# Patient Record
Sex: Female | Born: 1950 | Race: Black or African American | Hispanic: No | Marital: Single
Health system: Midwestern US, Community
[De-identification: ages and names within clinical notes are randomized; demographics above are authoritative.]

## PROBLEM LIST (undated history)

## (undated) DIAGNOSIS — I10 Essential (primary) hypertension: Secondary | ICD-10-CM

## (undated) DIAGNOSIS — E0590 Thyrotoxicosis, unspecified without thyrotoxic crisis or storm: Secondary | ICD-10-CM

## (undated) DIAGNOSIS — R935 Abnormal findings on diagnostic imaging of other abdominal regions, including retroperitoneum: Secondary | ICD-10-CM

## (undated) DIAGNOSIS — R634 Abnormal weight loss: Secondary | ICD-10-CM

## (undated) DIAGNOSIS — F17210 Nicotine dependence, cigarettes, uncomplicated: Secondary | ICD-10-CM

## (undated) DIAGNOSIS — M810 Age-related osteoporosis without current pathological fracture: Secondary | ICD-10-CM

## (undated) DIAGNOSIS — E7800 Pure hypercholesterolemia, unspecified: Secondary | ICD-10-CM

## (undated) DIAGNOSIS — R7303 Prediabetes: Principal | ICD-10-CM

## (undated) DIAGNOSIS — M816 Localized osteoporosis [Lequesne]: Secondary | ICD-10-CM

## (undated) DIAGNOSIS — M5416 Radiculopathy, lumbar region: Secondary | ICD-10-CM

## (undated) DIAGNOSIS — R3129 Other microscopic hematuria: Principal | ICD-10-CM

## (undated) DIAGNOSIS — E538 Deficiency of other specified B group vitamins: Principal | ICD-10-CM

---

## 2012-07-31 ENCOUNTER — Encounter

## 2016-10-26 ENCOUNTER — Emergency Department: Payer: Self-pay

## 2016-10-26 ENCOUNTER — Encounter: Payer: Self-pay | Admitting: Emergency Medicine

## 2016-10-26 ENCOUNTER — Observation Stay
Admission: EM | Admit: 2016-10-26 | Discharge: 2016-10-27 | Disposition: A | Discharge status: Home / Self Care | Payer: Self-pay | Attending: Internal Medicine | Admitting: Internal Medicine

## 2016-10-26 DIAGNOSIS — R079 Chest pain, unspecified: Secondary | ICD-10-CM | POA: Diagnosis present

## 2016-10-26 DIAGNOSIS — I10 Essential (primary) hypertension: Secondary | ICD-10-CM | POA: Insufficient documentation

## 2016-10-26 DIAGNOSIS — R0789 Other chest pain: Principal | ICD-10-CM | POA: Insufficient documentation

## 2016-10-26 DIAGNOSIS — E876 Hypokalemia: Secondary | ICD-10-CM | POA: Insufficient documentation

## 2016-10-26 DIAGNOSIS — Z885 Allergy status to narcotic agent status: Secondary | ICD-10-CM | POA: Insufficient documentation

## 2016-10-26 DIAGNOSIS — F17210 Nicotine dependence, cigarettes, uncomplicated: Secondary | ICD-10-CM | POA: Insufficient documentation

## 2016-10-26 HISTORY — DX: Essential (primary) hypertension: I10

## 2016-10-26 LAB — CBC
HEMATOCRIT: 41.3 % (ref 35.0–47.0)
HEMOGLOBIN: 14.3 g/dL (ref 12.0–16.0)
MCH: 31.2 pg (ref 26.0–34.0)
MCHC: 34.6 g/dL (ref 32.0–36.0)
MCV: 90.1 fL (ref 80.0–100.0)
Platelets: 269 10*3/uL (ref 150–440)
RBC: 4.59 MIL/uL (ref 3.80–5.20)
RDW: 14.3 % (ref 11.5–14.5)
WBC: 5.8 10*3/uL (ref 3.6–11.0)

## 2016-10-26 LAB — LIPID PANEL
CHOLESTEROL: 219 mg/dL — AB (ref 0–200)
HDL: 54 mg/dL (ref >40)
LDL Cholesterol: 145 mg/dL — ABNORMAL HIGH (ref 0–99)
Total CHOL/HDL Ratio: 4.1 RATIO
Triglycerides: 102 mg/dL (ref <150)
VLDL: 20 mg/dL (ref 0–40)

## 2016-10-26 LAB — COMPREHENSIVE METABOLIC PANEL
ALBUMIN: 4.7 g/dL (ref 3.5–5.0)
ALK PHOS: 89 U/L (ref 38–126)
ALT: 23 U/L (ref 14–54)
AST: 32 U/L (ref 15–41)
Anion gap: 13 (ref 5–15)
BUN: 10 mg/dL (ref 6–20)
CALCIUM: 10.1 mg/dL (ref 8.9–10.3)
CO2: 24 mmol/L (ref 22–32)
CREATININE: 0.76 mg/dL (ref 0.44–1.00)
Chloride: 101 mmol/L (ref 101–111)
GFR calc Af Amer: >60 mL/min (ref >60)
GFR calc non Af Amer: >60 mL/min (ref >60)
GLUCOSE: 81 mg/dL (ref 65–99)
Potassium: 3.4 mmol/L — ABNORMAL LOW (ref 3.5–5.1)
SODIUM: 138 mmol/L (ref 135–145)
Total Bilirubin: 0.6 mg/dL (ref 0.3–1.2)
Total Protein: 9.2 g/dL — ABNORMAL HIGH (ref 6.5–8.1)

## 2016-10-26 LAB — TROPONIN I
Troponin I: <0.03 ng/mL (ref <0.03)
Troponin I: <0.03 ng/mL (ref <0.03)

## 2016-10-26 IMAGING — DX DG CHEST 1V PORT
1 series · 1 of 1 positions shown · non-contrast
Comparison: Portable exam [9R] hours without priors for comparison.

CLINICAL DATA: Nonradiating central LEFT chest pain beginning
today, history hypertension

EXAM:
PORTABLE CHEST 1 VIEW

[chest ap]
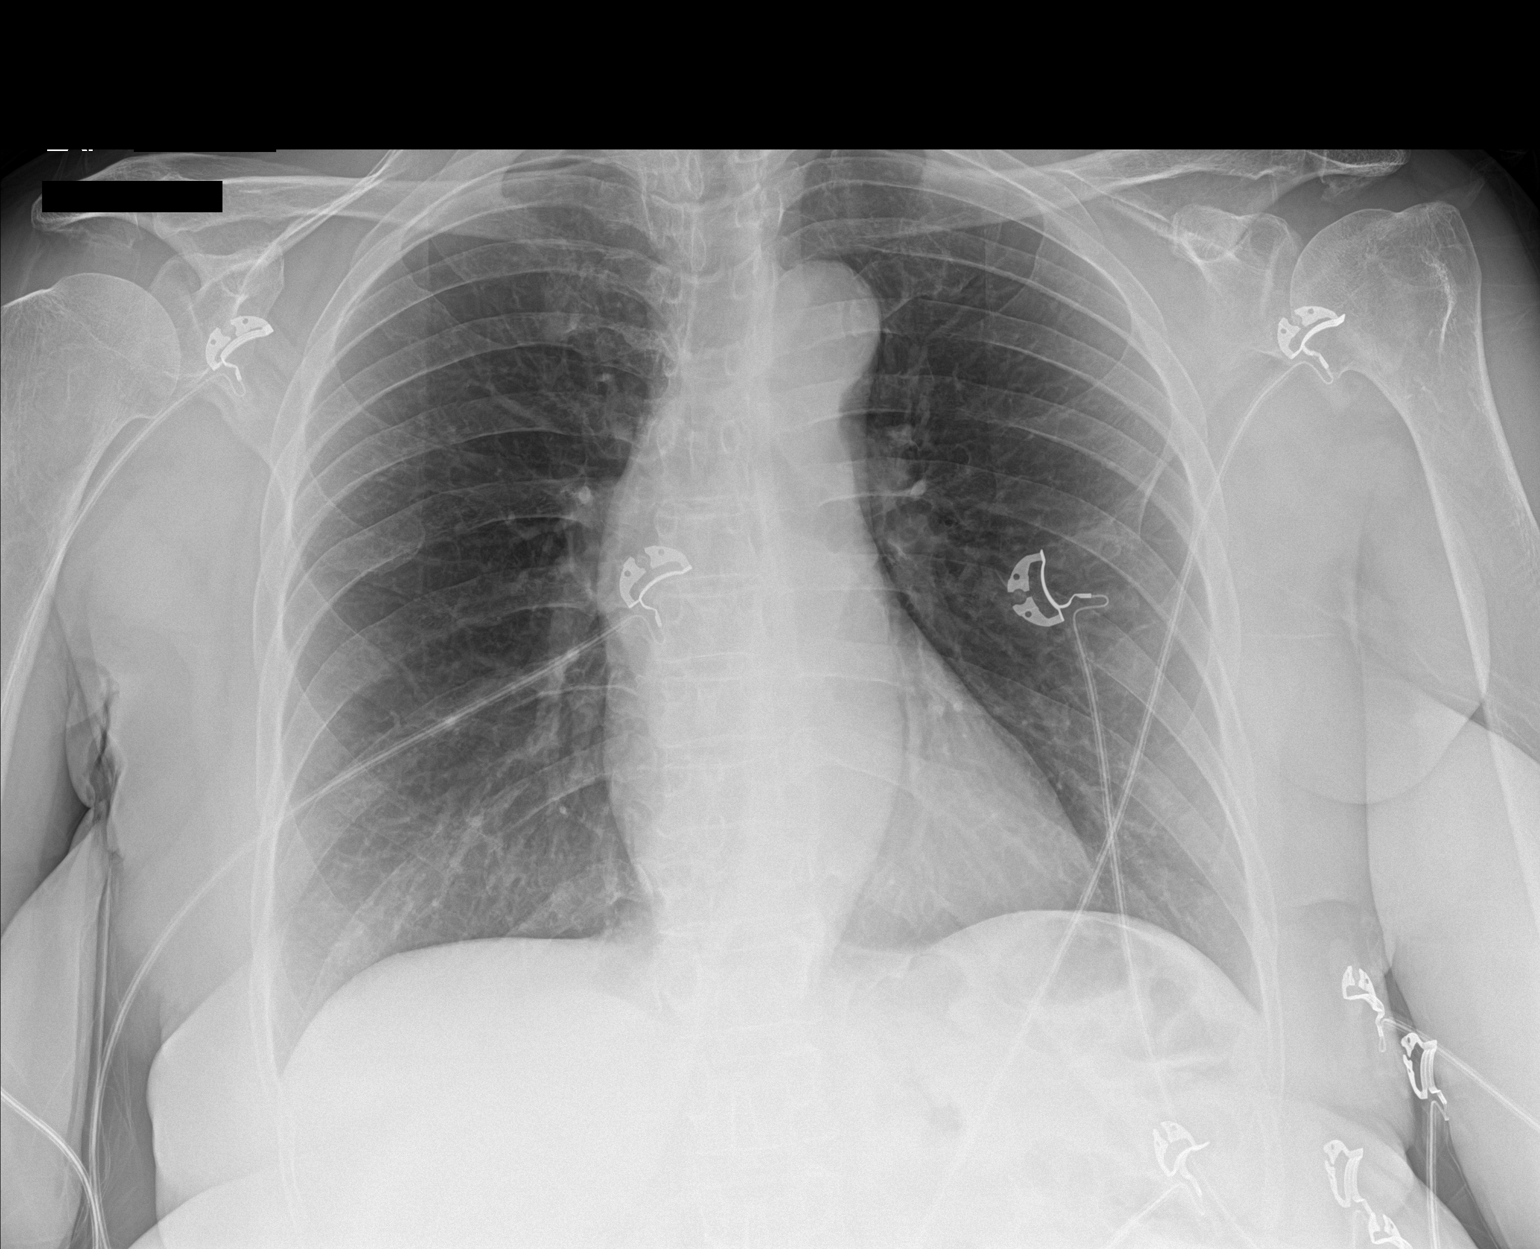

[1 of 1 positions shown; findings below may reference images not displayed]

FINDINGS: Normal heart size and pulmonary vascularity.

Mildly tortuous thoracic aorta with minimal atherosclerotic
calcification at arch.

Lungs clear.

No pleural effusion or pneumothorax.

Bones appear demineralized.
IMPRESSION: No acute abnormalities.

## 2016-10-26 MED ORDER — SODIUM CHLORIDE 0.9% FLUSH
3.0000 mL | Freq: Two times a day (BID) | INTRAVENOUS | Status: DC
  Administered 2016-10-26 – 2016-10-27 (×2): 3 mL via INTRAVENOUS

## 2016-10-26 MED ORDER — ACETAMINOPHEN 650 MG RE SUPP: 650.0000 mg | Freq: Four times a day (QID) | RECTAL | Status: DC | PRN

## 2016-10-26 MED ORDER — LISINOPRIL-HYDROCHLOROTHIAZIDE 20-12.5 MG PO TABS: 1.0000 | ORAL_TABLET | Freq: Every day | ORAL | Status: DC

## 2016-10-26 MED ORDER — ACETAMINOPHEN 325 MG PO TABS
650.0000 mg | ORAL_TABLET | Freq: Four times a day (QID) | ORAL | Status: DC | PRN
Start: 2016-10-26 — End: 2016-10-27

## 2016-10-26 MED ORDER — SODIUM CHLORIDE 0.9 % IV SOLN: 250.0000 mL | INTRAVENOUS | Status: DC | PRN

## 2016-10-26 MED ORDER — HYDRALAZINE HCL 20 MG/ML IJ SOLN: 10.0000 mg | Freq: Four times a day (QID) | INTRAMUSCULAR | Status: DC | PRN

## 2016-10-26 MED ORDER — METOPROLOL TARTRATE 25 MG PO TABS
25.0000 mg | ORAL_TABLET | Freq: Two times a day (BID) | ORAL | Status: DC
  Administered 2016-10-26: 25 mg via ORAL
  Filled 2016-10-26 (×2): qty 1

## 2016-10-26 MED ORDER — HYDROCHLOROTHIAZIDE 12.5 MG PO CAPS
12.5000 mg | ORAL_CAPSULE | Freq: Every day | ORAL | Status: DC
  Filled 2016-10-26: qty 1

## 2016-10-26 MED ORDER — POTASSIUM CHLORIDE CRYS ER 20 MEQ PO TBCR: 40.0000 meq | EXTENDED_RELEASE_TABLET | Freq: Once | ORAL | Status: DC

## 2016-10-26 MED ORDER — SENNOSIDES-DOCUSATE SODIUM 8.6-50 MG PO TABS: 1.0000 | ORAL_TABLET | Freq: Every evening | ORAL | Status: DC | PRN

## 2016-10-26 MED ORDER — LISINOPRIL 20 MG PO TABS
20.0000 mg | ORAL_TABLET | Freq: Every day | ORAL | Status: DC
  Filled 2016-10-26: qty 1

## 2016-10-26 MED ORDER — ENOXAPARIN SODIUM 40 MG/0.4ML ~~LOC~~ SOLN
40.0000 mg | SUBCUTANEOUS | Status: DC
  Administered 2016-10-26: 40 mg via SUBCUTANEOUS
  Filled 2016-10-26: qty 0.4

## 2016-10-26 MED ORDER — ASPIRIN 81 MG PO CHEW
81.0000 mg | CHEWABLE_TABLET | Freq: Every day | ORAL | Status: DC
  Administered 2016-10-27: 81 mg via ORAL
  Filled 2016-10-26: qty 1

## 2016-10-26 MED ORDER — NICOTINE 21 MG/24HR TD PT24
21.0000 mg | MEDICATED_PATCH | Freq: Every day | TRANSDERMAL | Status: DC
  Filled 2016-10-26: qty 1

## 2016-10-26 MED ORDER — ONDANSETRON HCL 4 MG/2ML IJ SOLN: 4.0000 mg | Freq: Four times a day (QID) | INTRAMUSCULAR | Status: DC | PRN

## 2016-10-26 MED ORDER — METOPROLOL TARTRATE 25 MG PO TABS
25.0000 mg | ORAL_TABLET | Freq: Two times a day (BID) | ORAL | Status: DC
  Administered 2016-10-27: 25 mg via ORAL
  Filled 2016-10-26: qty 1

## 2016-10-26 MED ORDER — SODIUM CHLORIDE 0.9% FLUSH: 3.0000 mL | INTRAVENOUS | Status: DC | PRN

## 2016-10-26 MED ORDER — TRAMADOL HCL 50 MG PO TABS: 50.0000 mg | ORAL_TABLET | Freq: Four times a day (QID) | ORAL | Status: DC | PRN

## 2016-10-26 MED ORDER — ONDANSETRON HCL 4 MG PO TABS: 4.0000 mg | ORAL_TABLET | Freq: Four times a day (QID) | ORAL | Status: DC | PRN

## 2016-10-26 MED ORDER — NITROGLYCERIN 0.4 MG SL SUBL: 0.4000 mg | SUBLINGUAL_TABLET | SUBLINGUAL | Status: DC | PRN

## 2016-10-26 MED ORDER — ASPIRIN 81 MG PO CHEW
324.0000 mg | CHEWABLE_TABLET | Freq: Once | ORAL | Status: AC
  Administered 2016-10-26: 324 mg via ORAL
  Filled 2016-10-26: qty 4

## 2016-10-26 NOTE — ED Triage Notes (Signed)
Pt reports non-radiating central to left chest pain that started today. Pt reports the pain is intermittent and denies any associated symptoms. Pt reports she has been feeling really tired lately.

## 2016-10-26 NOTE — ED Notes (Signed)
Pt stuck x4 for blood and IV.  Unsuccessful.  EDP notified and to ultrasound IV at this time.

## 2016-10-26 NOTE — ED Provider Notes (Signed)
[facility] Emergency Department Provider Note  ____________________________________________  Time seen: Approximately 3:55 PM  I have reviewed the triage vital signs and the nursing notes.   HISTORY  Chief Complaint Chest Pain    HPI Kimberly Levine is a 66 y.o. female who complains of central chest pain that started today approximately 3 PM. Pain is intermittent, lasting a minute at a time, reoccurring multiple times. Nonradiating, no associated shortness of breath diaphoresis or vomiting. She does report that she feels dizzy and somewhat out. Denies any trauma. No paresthesias numbness tingling weakness headache. Never had anything like this before. Does have a history of hypertension as well as prediabetes, she is a smoker. Patient reports that her blood pressures also running higher, normally is about 1:30 systolic and today is 180. Has been compliant with her antihypertensive.    Past Medical History:  Diagnosis Date  . Hypertension      There are no active problems to display for this patient.    No past surgical history on file. right neck lipoma resection  Prior to Admission medications   Medication Sig Start Date End Date Taking? Authorizing Provider  lisinopril-hydrochlorothiazide (PRINZIDE,ZESTORETIC) 20-12.5 MG tablet Take 1 tablet by mouth daily.   Yes [provider]  lisinopril HCTZ   Allergies Morphine and related and Percocet [oxycodone-acetaminophen]   No family history on file.  Social History Social History  Substance Use Topics  . Smoking status: Not on file  . Smokeless tobacco: Not on file  . Alcohol use Not on file  positive daily smoker. No significant alcohol use.  Review of Systems  Constitutional:   No fever or chills.  ENT:   No sore throat. No rhinorrhea. Cardiovascular:   positive as above chest pain without syncope. Respiratory:   No dyspnea or cough. Gastrointestinal:   Negative for abdominal  pain, vomiting and diarrhea.  Musculoskeletal:   Negative for focal pain or swelling All other systems reviewed and are negative except as documented above in ROS and HPI.  ____________________________________________   PHYSICAL EXAM:  VITAL SIGNS: ED Triage Vitals  Enc Vitals Group     BP 10/26/16 1538 (!) 186/113     Pulse Rate 10/26/16 1538 (!) 101     Resp 10/26/16 1538 18     Temp 10/26/16 1538 98 F (36.7 C)     Temp Source 10/26/16 1538 Oral     SpO2 10/26/16 1538 100 %     Weight 10/26/16 1539 167 lb (75.8 kg)     Height 10/26/16 1539 5' 1.5" (1.562 m)     Head Circumference --      Peak Flow --      Pain Score 10/26/16 1538 0     Pain Loc --      Pain Edu? --      Excl. in GC? --     Vital signs reviewed, nursing assessments reviewed.   Constitutional:   Alert and oriented. Well appearing and in no distress. Eyes:   No scleral icterus.  EOMI. No nystagmus. No conjunctival pallor. PERRL. ENT   Head:   Normocephalic and atraumatic.   Nose:   No congestion/rhinnorhea.    Mouth/Throat:   MMM, no pharyngeal erythema. No peritonsillar mass.    Neck:   No meningismus. Full ROM Hematological/Lymphatic/Immunilogical:   No cervical lymphadenopathy. Cardiovascular:   RRR. Symmetric bilateral radial and DP pulses.  No murmurs.  Respiratory:   Normal respiratory effort without tachypnea/retractions. Breath sounds are clear  and equal bilaterally. No wheezes/rales/rhonchi. Gastrointestinal:   Soft and nontender. Non distended. There is no CVA tenderness.  No rebound, rigidity, or guarding. Genitourinary:   deferred Musculoskeletal:   Normal range of motion in all extremities. No joint effusions.  No lower extremity tenderness.  No edema.chest wall nontender. Neurologic:   Normal speech and language.  Motor grossly intact. No gross focal neurologic deficits are appreciated.  Skin:    Skin is warm, dry and intact. No rash noted.  No petechiae, purpura, or  bullae.  ____________________________________________    LABS (pertinent positives/negatives) (all labs ordered are listed, but only abnormal results are displayed) Labs Reviewed  CBC  TROPONIN I  COMPREHENSIVE METABOLIC PANEL   ____________________________________________   EKG  EKG interpreted by me Normal sinus rhythm rate of 82, left axis, normal intervals. High lateral Q waves, ST depression and T-wave inversions in the inferior leads, slight T-wave inversions in V3 and V4. No prior EKGs for comparison. Concerning for possible inferior ischemia.  Repeat EKG performed at 4:18 PM, interpreted by me Sinus rhythm rate of 69, left axis, normal intervals. Still slight ST depression in the inferior leads, but overall improved compared to the initial EKG. ____________________________________________    RADIOLOGY  Dg Chest Portable 1 View  Result Date: 10/26/2016 CLINICAL DATA:  Nonradiating central LEFT chest pain beginning today, history hypertension EXAM: PORTABLE CHEST 1 VIEW COMPARISON:  Portable exam 1605 hours without priors for comparison. FINDINGS: Normal heart size and pulmonary vascularity. Mildly tortuous thoracic aorta with minimal atherosclerotic calcification at arch. Lungs clear. No pleural effusion or pneumothorax. Bones appear demineralized. IMPRESSION: No acute abnormalities. Electronically Signed   By: Ulyses SouthwardMark  Boles M.D.   On: 10/26/2016 16:24    ____________________________________________   PROCEDURES Procedures Peripheral IV insertion by physician Indication: Multiple failed attempts by nursing staff, need for IV access and/or blood samples for workup Performed under continuous real-time ultrasound visualization Area cleaned with chlorhexidine. 20-gauge IV successfully placed in the right antecubital fossa. 1 attempt, no complications, EBL 0.   ____________________________________________   INITIAL IMPRESSION / ASSESSMENT AND PLAN / ED  COURSE  Pertinent labs & imaging results that were available during my care of the patient were reviewed by me and considered in my medical decision making (see chart for details).  patient presents with somewhat atypical chest pain but concerning EKG. We'll check labs chest x-ray, repeat EKG to evaluate for any evolution. Patient is not low risk, but symptoms are not consistent with unstable angina. Troponin normal, with still plan to hospitalize the patient for further evaluation and risk stratification.  Clinical Course as of Oct 26 1737  Fri Oct 26, 2016  1645 Kore[place] piv placed by me, labs pending  [PS]    Clinical Course User Index [PS] Sharman CheekStafford, Donal Lynam, MD    ----------------------------------------- 5:39 PM on 10/26/2016 -----------------------------------------  Troponin negative, chest pain resolved without nitrates. I will discuss with hospitalist for further evaluation.   ____________________________________________   FINAL CLINICAL IMPRESSION(S) / ED DIAGNOSES  Final diagnoses:  Chest pain, unspecified type      New Prescriptions   No medications on file     Portions of this note were generated with dragon dictation software. Dictation errors may occur despite best attempts at proofreading.    Sharman CheekStafford, Graham Hyun, MD 10/26/16 1740

## 2016-10-26 NOTE — H&P (Signed)
[facility]   PATIENT NAME: Kimberly Levine    MR#:  [id number]  DATE OF BIRTH:  10-04-1950  DATE OF ADMISSION:  10/26/2016  PRIMARY CARE PHYSICIAN: Patient, No Pcp Per   REQUESTING/REFERRING PHYSICIAN: dr Scotty Courtstafford  CHIEF COMPLAINT:    Generalized weakness, elevated blood pressure and chest pain HISTORY OF PRESENT ILLNESS:  Kimberly Levine  is a 66 y.o. female with a known history of Essential hypertension who presents today with above complaint. Patient reports over the past 3 days she has had generalized weakness and not feeling herself. Today she had about 5 second episodes of chest pain that radiated to her jaw. She denies shortness of breath, nausea associated with the chest pain. She denies relieving or aggravating factors but she says it comes onset only and then went away after just 5-10 seconds. She had a few episodes today. She has noted that her blood pressure is elevated more so than normal. She says that her blood pressures usually control. [place] [facility] [place] and will need to establish primary care. She is currently chest pain-free. Her initial EKG did show ST depression in the inferior leads. Repeat EKG shows ST depression in the inferior leads but improved compared to initial EKG. During the interim she has received nitroglycerin. Her blood pressure was 186/113 when she arrived to the emergency room.   PAST MEDICAL HISTORY:   Past Medical History:  Diagnosis Date  . Hypertension     PAST SURGICAL HISTORY:  None  SOCIAL HISTORY:  He smokes one pack a day no IV drug use  FAMILY HISTORY:  [place] hypertension  DRUG ALLERGIES:   Allergies  Allergen Reactions  . Morphine And Related Itching  . Percocet [Oxycodone-Acetaminophen] Itching    REVIEW OF SYSTEMS:   Review of Systems  Constitutional: Positive for malaise/fatigue. Negative for chills and fever.  HENT: Negative.  Negative for ear discharge,  ear pain, hearing loss, nosebleeds and sore throat.   Eyes: Negative.  Negative for blurred vision and pain.  Respiratory: Negative.  Negative for cough, hemoptysis, shortness of breath and wheezing.   Cardiovascular: Positive for chest pain. Negative for palpitations and leg swelling.  Gastrointestinal: Negative.  Negative for abdominal pain, blood in stool, diarrhea, nausea and vomiting.  Genitourinary: Negative.  Negative for dysuria.  Musculoskeletal: Negative.  Negative for back pain.  Skin: Negative.   Neurological: Positive for weakness. Negative for dizziness, tremors, speech change, focal weakness, seizures and headaches.  Endo/Heme/Allergies: Negative.  Does not bruise/bleed easily.  Psychiatric/Behavioral: Negative.  Negative for depression, hallucinations and suicidal ideas.    MEDICATIONS AT HOME:   Prior to Admission medications   Medication Sig Start Date End Date Taking? Authorizing Provider  lisinopril-hydrochlorothiazide (PRINZIDE,ZESTORETIC) 20-12.5 MG tablet Take 1 tablet by mouth daily.   Yes [provider]      VITAL SIGNS:  Blood pressure (!) 158/109, pulse 67, temperature 98 F (36.7 C), temperature source Oral, resp. rate (!) 22, height 5' 1.5" (1.562 m), weight 75.8 kg (167 lb), SpO2 97 %.  PHYSICAL EXAMINATION:   Physical Exam  Constitutional: She is oriented to person, place, and time and well-developed, well-nourished, and in no distress. No distress.  HENT:  Head: Normocephalic.  Eyes: No scleral icterus.  Neck: Normal range of motion. Neck supple. No JVD present. No tracheal deviation present.  Cardiovascular: Normal rate, regular rhythm and normal heart sounds.  Exam reveals no gallop and no friction rub.  No murmur heard. Pulmonary/Chest: Effort normal and breath sounds normal. No respiratory distress. She has no wheezes. She has no rales. She exhibits no tenderness.  Abdominal: Soft. Bowel sounds are normal. She exhibits no distension  and no mass. There is no tenderness. There is no rebound and no guarding.  Musculoskeletal: Normal range of motion. She exhibits no edema.  Neurological: She is alert and oriented to person, place, and time.  Skin: Skin is warm. No rash noted. No erythema.  Psychiatric: Affect and judgment normal.      LABORATORY PANEL:   CBC  Recent Labs Lab 10/26/16 1537  WBC 5.8  HGB 14.3  HCT 41.3  PLT 269   ------------------------------------------------------------------------------------------------------------------  Chemistries   Recent Labs Lab 10/26/16 1555  NA 138  K 3.4*  CL 101  CO2 24  GLUCOSE 81  BUN 10  CREATININE 0.76  CALCIUM 10.1  AST 32  ALT 23  ALKPHOS 89  BILITOT 0.6   ------------------------------------------------------------------------------------------------------------------  Cardiac Enzymes  Recent Labs Lab 10/26/16 1537  TROPONINI <0.03   ------------------------------------------------------------------------------------------------------------------  RADIOLOGY:  Dg Chest Portable 1 View  Result Date: 10/26/2016 CLINICAL DATA:  Nonradiating central LEFT chest pain beginning today, history hypertension EXAM: PORTABLE CHEST 1 VIEW COMPARISON:  Portable exam 1605 hours without priors for comparison. FINDINGS: Normal heart size and pulmonary vascularity. Mildly tortuous thoracic aorta with minimal atherosclerotic calcification at arch. Lungs clear. No pleural effusion or pneumothorax. Bones appear demineralized. IMPRESSION: No acute abnormalities. Electronically Signed   By: Ulyses Southward M.D.   On: 10/26/2016 16:24    EKG:  Initial EKG with ST depression in inferior leads Repeat EKG is similar however somewhat improved  IMPRESSION AND PLAN:   Exceed 17 female with history of hypertension who moved to the [facility] area about a week ago and now presents with generalized weakness, elevated blood pressure and chest pain.  1 Chest  pain: Continue telemetry monitoring Continued trending troponins If all troponins negative 3 and patient will undergo Myoview in a.m. Start aspirin and metoprolol, nitroglycerin subungual when necessary chest pain Check lipid panel and A1c  2. Uncontrolled hypertension: Add metoprolol to current regimen When necessary hydralazine Check echocardiogram to evaluate for LVH  3 hypokalemia: Repleted  4. Tobacco dependence: Patient is encouraged to quit smoking. Counseling was provided for 4 minutes. Nicotine patch ordered   All the records are reviewed and case discussed with ED provider. Management plans discussed with the patient and she is in agreement  She will need to establish primary care physician in this area. CODE STATUS: Full  TOTAL TIME TAKING CARE OF THIS PATIENT: 41 minutes.    Pepper Wyndham M.D on 10/26/2016 at 6:14 PM  Between 7am to 6pm - Pager - [phone]  After 6pm go to [email or web address] - password [email or web address]  [facility]  Office  [phone]  CC: Primary care physician; Patient, No Pcp Per

## 2016-10-27 ENCOUNTER — Observation Stay (HOSPITAL_BASED_OUTPATIENT_CLINIC_OR_DEPARTMENT_OTHER): Payer: Self-pay

## 2016-10-27 DIAGNOSIS — R079 Chest pain, unspecified: Secondary | ICD-10-CM

## 2016-10-27 LAB — TROPONIN I
Troponin I: <0.03 ng/mL (ref <0.03)
Troponin I: <0.03 ng/mL (ref <0.03)

## 2016-10-27 LAB — NM MYOCAR MULTI W/SPECT W/WALL MOTION / EF
CHL CUP RESTING HR STRESS: 69 {beats}/min
LV dias vol: 31 mL (ref 46–106)
LV sys vol: 10 mL
NUC STRESS TID: 0.91
Peak HR: 122 {beats}/min

## 2016-10-27 LAB — HEMOGLOBIN A1C
Hgb A1c MFr Bld: 5.8 % — ABNORMAL HIGH (ref 4.8–5.6)
Mean Plasma Glucose: 119.76 mg/dL

## 2016-10-27 MED ORDER — TECHNETIUM TC 99M TETROFOSMIN IV KIT
14.3400 | PACK | Freq: Once | INTRAVENOUS | Status: AC | PRN
  Administered 2016-10-27: 33 via INTRAVENOUS

## 2016-10-27 MED ORDER — METOPROLOL TARTRATE 25 MG PO TABS: 25.0000 mg | ORAL_TABLET | Freq: Two times a day (BID) | ORAL | 0 refills | Status: AC

## 2016-10-27 MED ORDER — REGADENOSON 0.4 MG/5ML IV SOLN
0.4000 mg | Freq: Once | INTRAVENOUS | Status: AC
  Administered 2016-10-27: 0.4 mg via INTRAVENOUS

## 2016-10-27 MED ORDER — HYDROCHLOROTHIAZIDE 12.5 MG PO CAPS
12.5000 mg | ORAL_CAPSULE | Freq: Every day | ORAL | Status: DC
  Administered 2016-10-27: 12.5 mg via ORAL

## 2016-10-27 MED ORDER — TECHNETIUM TC 99M TETROFOSMIN IV KIT
14.3400 | PACK | Freq: Once | INTRAVENOUS | Status: AC | PRN
  Administered 2016-10-27: 14.34 via INTRAVENOUS

## 2016-10-27 MED ORDER — LISINOPRIL 20 MG PO TABS
20.0000 mg | ORAL_TABLET | Freq: Every day | ORAL | Status: DC
  Administered 2016-10-27: 20 mg via ORAL

## 2016-10-27 MED ORDER — NICOTINE 21 MG/24HR TD PT24: 21.0000 mg | MEDICATED_PATCH | Freq: Every day | TRANSDERMAL | 0 refills | Status: AC

## 2016-10-27 NOTE — Discharge Summary (Signed)
[facility] -  at [facility]   PATIENT NAME: Kimberly Levine    MR#:  [id number]  DATE OF BIRTH:  08-18-1950  DATE OF ADMISSION:  10/26/2016 ADMITTING PHYSICIAN: Adrian Saran, MD  DATE OF DISCHARGE: 10/27/2016 PRIMARY CARE PHYSICIAN: Patient, No Pcp Per    ADMISSION DIAGNOSIS:  Chest pain, unspecified type [R07.9]  DISCHARGE DIAGNOSIS:  Atypical chest pain noncardiac  SECONDARY DIAGNOSIS:   Past Medical History:  Diagnosis Date  . Hypertension     HOSPITAL COURSE:  hpi  Kimberly Levine  is a 66 y.o. female with a known history of Essential hypertension who presents today with above complaint. Patient reports over the past 3 days she has had generalized weakness and not feeling herself. Today she had about 5 second episodes of chest pain that radiated to her jaw. She denies shortness of breath, nausea associated with the chest pain. She denies relieving or aggravating factors but she says it comes onset only and then went away after just 5-10 seconds. She had a few episodes today. She has noted that her blood pressure is elevated more so than normal. She says that her blood pressures usually control. She moved to [facility] about a week ago and will need to establish primary care. She is currently chest pain-free. Her initial EKG did show ST depression in the inferior leads. Repeat EKG shows ST depression in the inferior leads but improved compared to initial EKG. During the interim she has received nitroglycerin. Her blood pressure was 186/113 when she arrived to the emergency room.  1 Chest pain: Resolved Acute MI ruled out with 3 negative troponins Patient had Myoview test test which is a low risk study, left ventricular ejection fraction is greater than 65% Outpatient echocardiogram is recommended LDL at 145 ; hemoglobin A1c 5.8  2. Uncontrolled hypertension:  Blood pressure improved with metoprolol  Outpatient echocardiogram  3 hypokalemia:  Repleted  4. Tobacco dependence: Patient is encouraged to quit smoking. Counseling was provided for 4 minutes. Nicotine patch ordered   DISCHARGE CONDITIONS:   Stable  CONSULTS OBTAINED:     PROCEDURES -Myoview stress test low risk study  DRUG ALLERGIES:   Allergies  Allergen Reactions  . Morphine And Related Itching  . Percocet [Oxycodone-Acetaminophen] Itching    DISCHARGE MEDICATIONS:   Current Discharge Medication List    START taking these medications   Details  metoprolol tartrate (LOPRESSOR) 25 MG tablet Take 1 tablet (25 mg total) by mouth 2 (two) times daily. Qty: 60 tablet, Refills: 0    nicotine (NICODERM CQ - DOSED IN MG/24 HOURS) 21 mg/24hr patch Place 1 patch (21 mg total) onto the skin daily. Qty: 28 patch, Refills: 0      CONTINUE these medications which have NOT CHANGED   Details  lisinopril-hydrochlorothiazide (PRINZIDE,ZESTORETIC) 20-12.5 MG tablet Take 1 tablet by mouth daily.         DISCHARGE INSTRUCTIONS:   Follow-up with primary care physician in a week   DIET:  Cardiac diet  DISCHARGE CONDITION:  Stable  ACTIVITY:  Activity as tolerated  OXYGEN:  Home Oxygen: No.   Oxygen Delivery: room air  DISCHARGE LOCATION:  home   If you experience worsening of your admission symptoms, develop shortness of breath, life threatening emergency, suicidal or homicidal thoughts you must seek medical attention immediately by calling 911 or calling your MD immediately  if symptoms less severe.  You Must read complete instructions/literature along with all the possible adverse reactions/side effects for all  the Medicines you take and that have been prescribed to you. Take any new Medicines after you have completely understood and accpet all the possible adverse reactions/side effects.   Please note  You were cared for by a hospitalist during your hospital stay. If you have any questions about your discharge medications or the care you  received while you were in the hospital after you are discharged, you can call the unit and asked to speak with the hospitalist on call if the hospitalist that took care of you is not available. Once you are discharged, your primary care physician will handle any further medical issues. Please note that NO REFILLS for any discharge medications will be authorized once you are discharged, as it is imperative that you return to your primary care physician (or establish a relationship with a primary care physician if you do not have one) for your aftercare needs so that they can reassess your need for medications and monitor your lab values.     Today  Chief Complaint  Patient presents with  . Chest Pain   Patient is resting comfortably denies any chest pain or shortness of breath. Wants to go home. Patient had stress test today which is low risk study  ROS:  CONSTITUTIONAL: Denies fevers, chills. Denies any fatigue, weakness.  EYES: Denies blurry vision, double vision, eye pain. EARS, NOSE, THROAT: Denies tinnitus, ear pain, hearing loss. RESPIRATORY: Denies cough, wheeze, shortness of breath.  CARDIOVASCULAR: Denies chest pain, palpitations, edema.  GASTROINTESTINAL: Denies nausea, vomiting, diarrhea, abdominal pain. Denies bright red blood per rectum. GENITOURINARY: Denies dysuria, hematuria. ENDOCRINE: Denies nocturia or thyroid problems. HEMATOLOGIC AND LYMPHATIC: Denies easy bruising or bleeding. SKIN: Denies rash or lesion. MUSCULOSKELETAL: Denies pain in neck, back, shoulder, knees, hips or arthritic symptoms.  NEUROLOGIC: Denies paralysis, paresthesias.  PSYCHIATRIC: Denies anxiety or depressive symptoms.   VITAL SIGNS:  Blood pressure 128/79, pulse (!) 58, temperature 98.3 F (36.8 C), temperature source Oral, resp. rate 14, height 5' 1.5" (1.562 m), weight 75.8 kg (167 lb), SpO2 100 %.  I/O:    Intake/Output Summary (Last 24 hours) at 10/27/16 1301 Last data filed at  10/27/16 1222  Gross per 24 hour  Intake                0 ml  Output              550 ml  Net             -550 ml    PHYSICAL EXAMINATION:  GENERAL:  66 y.o.-year-old patient lying in the bed with no acute distress.  EYES: Pupils equal, round, reactive to light and accommodation. No scleral icterus. Extraocular muscles intact.  HEENT: Head atraumatic, normocephalic. Oropharynx and nasopharynx clear.  NECK:  Supple, no jugular venous distention. No thyroid enlargement, no tenderness.  LUNGS: Normal breath sounds bilaterally, no wheezing, rales,rhonchi or crepitation. No use of accessory muscles of respiration.  CARDIOVASCULAR: S1, S2 normal. No murmurs, rubs, or gallops.  ABDOMEN: Soft, non-tender, non-distended. Bowel sounds present. No organomegaly or mass.  EXTREMITIES: No pedal edema, cyanosis, or clubbing.  NEUROLOGIC: Cranial nerves II through XII are intact. Muscle strength 5/5 in all extremities. Sensation intact. Gait not checked.  PSYCHIATRIC: The patient is alert and oriented x 3.  SKIN: No obvious rash, lesion, or ulcer.   DATA REVIEW:   CBC  Recent Labs Lab 10/26/16 1537  WBC 5.8  HGB 14.3  HCT 41.3  PLT 269  Chemistries   Recent Labs Lab 10/26/16 1555  NA 138  K 3.4*  CL 101  CO2 24  GLUCOSE 81  BUN 10  CREATININE 0.76  CALCIUM 10.1  AST 32  ALT 23  ALKPHOS 89  BILITOT 0.6    Cardiac Enzymes  Recent Labs Lab 10/27/16 1048  TROPONINI <0.03    Microbiology Results  No results found for this or any previous visit.  RADIOLOGY:  Nm Myocar Multi W/spect W/wall Motion / Ef  Result Date: 10/27/2016  Low risk study without evidence of ischemia or scar.  The left ventricular ejection fraction is hyperdynamic (>65%).  Horizontal ST segment depression ST segment depression of 2 mm was noted during stress in the II, III and aVF leads.    Dg Chest Portable 1 View  Result Date: 10/26/2016 CLINICAL DATA:  Nonradiating central LEFT chest pain  beginning today, history hypertension EXAM: PORTABLE CHEST 1 VIEW COMPARISON:  Portable exam 1605 hours without priors for comparison. FINDINGS: Normal heart size and pulmonary vascularity. Mildly tortuous thoracic aorta with minimal atherosclerotic calcification at arch. Lungs clear. No pleural effusion or pneumothorax. Bones appear demineralized. IMPRESSION: No acute abnormalities. Electronically Signed   By: Ulyses SouthwardMark  Boles M.D.   On: 10/26/2016 16:24    EKG:   Orders placed or performed during the hospital encounter of 10/26/16  . EKG 12-Lead  . EKG 12-Lead  . ED EKG within 10 minutes  . ED EKG within 10 minutes  . Repeat EKG  . Repeat EKG      Management plans discussed with the patient, family and they are in agreement.  CODE STATUS:     Code Status Orders        Start     Ordered   10/26/16 2009  Full code  Continuous     10/26/16 2008    Code Status History    Date Active Date Inactive Code Status Order ID Comments User Context   This patient has a current code status but no historical code status.      TOTAL TIME TAKING CARE OF THIS PATIENT: 43 minutes.   Note: This dictation was prepared with Dragon dictation along with smaller phrase technology. Any transcriptional errors that result from this process are unintentional.   @M[facility] @  on 10/27/2016 at 1:01 PM  Between 7am to 6pm - Pager - [phone]  After 6pm go to [email or web address] - password [email or web address]  [facility]io [facility]  Hospitalists  Office  [phone]  CC: Primary care physician; Patient, No Pcp Per

## 2016-10-27 NOTE — Care Management Note (Signed)
Case Management Note  Patient Details  Name: Nicole CellaGayle Nienow MRN: [id number] Date of Birth: 11/12/1950  Subjective/Objective:     Ms Maudie Mercuryettaway [facility]. She was given discount medication coupons for [employer].                Action/Plan:   Expected Discharge Date:  10/27/16               Expected Discharge Plan:     In-House Referral:  NA  Discharge planning Services  NA  Post Acute Care Choice:  NA Choice offered to:     DME Arranged:  N/A DME Agency:  NA  HH Arranged:  NA HH Agency:  NA  Status of Service:  Completed, signed off  If discussed at Long Length of Stay Meetings, dates discussed:    Additional Comments:  Milany Geck A, RN 10/27/2016, 2:01 PM

## 2016-10-27 NOTE — Discharge Instructions (Signed)
Follow-up with [facility] in a week; get outpatient echocardiogram

## 2016-10-27 NOTE — Progress Notes (Signed)
Went over discharge instructions with the patient. Discontinue peripheral IV and telemetry monitoring. Provide information for the patient about resources for prescription help and for setting up PCP. NT to transport patient.

## 2016-10-28 LAB — HIV ANTIBODY (ROUTINE TESTING W REFLEX): HIV SCREEN 4TH GENERATION: NONREACTIVE

## 2016-10-30 ENCOUNTER — Telehealth: Payer: Self-pay | Admitting: Nurse Practitioner

## 2016-10-30 NOTE — Telephone Encounter (Signed)
Callback

## 2016-11-01 ENCOUNTER — Telehealth: Payer: Self-pay

## 2016-11-01 NOTE — Telephone Encounter (Signed)
Called pt back. Left message no answer.

## 2016-12-04 ENCOUNTER — Telehealth: Payer: Self-pay | Admitting: Adult Health Nurse Practitioner

## 2016-12-04 NOTE — Telephone Encounter (Signed)
Wants call back

## 2016-12-05 ENCOUNTER — Telehealth: Payer: Self-pay

## 2016-12-05 NOTE — Telephone Encounter (Signed)
Called pt back. Pt wanted to know about eligibility.

## 2016-12-18 ENCOUNTER — Ambulatory Visit: Payer: Self-pay

## 2018-04-02 DIAGNOSIS — M2550 Pain in unspecified joint: Secondary | ICD-10-CM | POA: Diagnosis not present

## 2018-04-02 DIAGNOSIS — Z1159 Encounter for screening for other viral diseases: Secondary | ICD-10-CM | POA: Diagnosis not present

## 2018-04-02 DIAGNOSIS — I10 Essential (primary) hypertension: Secondary | ICD-10-CM | POA: Diagnosis not present

## 2018-04-02 DIAGNOSIS — Z1239 Encounter for other screening for malignant neoplasm of breast: Secondary | ICD-10-CM | POA: Diagnosis not present

## 2018-04-02 DIAGNOSIS — R8290 Unspecified abnormal findings in urine: Secondary | ICD-10-CM | POA: Diagnosis not present

## 2018-04-02 DIAGNOSIS — R7303 Prediabetes: Secondary | ICD-10-CM | POA: Diagnosis not present

## 2018-04-02 DIAGNOSIS — Z780 Asymptomatic menopausal state: Secondary | ICD-10-CM | POA: Diagnosis not present

## 2018-04-02 DIAGNOSIS — Z0000 Encounter for general adult medical examination without abnormal findings: Secondary | ICD-10-CM | POA: Diagnosis not present

## 2018-04-02 DIAGNOSIS — N390 Urinary tract infection, site not specified: Secondary | ICD-10-CM | POA: Diagnosis not present

## 2018-04-02 DIAGNOSIS — J309 Allergic rhinitis, unspecified: Secondary | ICD-10-CM | POA: Diagnosis not present

## 2018-04-02 DIAGNOSIS — E782 Mixed hyperlipidemia: Secondary | ICD-10-CM | POA: Diagnosis not present

## 2018-04-02 DIAGNOSIS — R69 Illness, unspecified: Secondary | ICD-10-CM | POA: Diagnosis not present

## 2018-04-03 ENCOUNTER — Other Ambulatory Visit: Payer: Self-pay | Admitting: Student

## 2018-04-03 DIAGNOSIS — Z1231 Encounter for screening mammogram for malignant neoplasm of breast: Secondary | ICD-10-CM

## 2018-04-07 DIAGNOSIS — R768 Other specified abnormal immunological findings in serum: Secondary | ICD-10-CM | POA: Diagnosis not present

## 2018-04-07 DIAGNOSIS — M810 Age-related osteoporosis without current pathological fracture: Secondary | ICD-10-CM | POA: Diagnosis not present

## 2018-11-18 LAB — HM DEXA SCAN

## 2018-11-26 ENCOUNTER — Encounter

## 2018-12-09 ENCOUNTER — Ambulatory Visit: Payer: BLUE CROSS/BLUE SHIELD | Primary: Internal Medicine

## 2018-12-16 NOTE — Progress Notes (Signed)
Letter sent to patient asking her to schedule LDCT.

## 2019-04-13 LAB — HM COLONOSCOPY

## 2019-05-04 LAB — HM MAMMOGRAPHY

## 2019-05-07 ENCOUNTER — Ambulatory Visit: Attending: Obstetrics | Primary: Internal Medicine

## 2019-05-07 ENCOUNTER — Ambulatory Visit
Admit: 2019-05-07 | Discharge: 2019-05-07 | Payer: PRIVATE HEALTH INSURANCE | Attending: Obstetrics | Primary: Internal Medicine

## 2019-05-07 DIAGNOSIS — Z01419 Encounter for gynecological examination (general) (routine) without abnormal findings: Secondary | ICD-10-CM

## 2019-05-07 LAB — AMB POC SMEAR, STAIN & INTERPRET, WET MOUNT

## 2019-05-07 MED ORDER — METRONIDAZOLE 0.75 % VAGINAL GEL
0.75 % (37.5mg/5 gram) | Freq: Every evening | VAGINAL | 1 refills | Status: AC
Start: 2019-05-07 — End: 2019-05-12

## 2019-05-07 MED ORDER — FLUCONAZOLE 150 MG TAB
150 mg | ORAL_TABLET | ORAL | 1 refills | Status: DC
Start: 2019-05-07 — End: 2021-02-22

## 2019-05-07 NOTE — Progress Notes (Signed)
HPI: Tracy Jordan is a 69 y.o. female, [phone], h/o abdominal hysterectomy for uterine fibroids and pelvic pain at age 89, who presents today for the following:  Chief Complaint   Patient presents with   ??? Gyn Exam   ??? New Patient        She denies abnormal vaginal bleeding. She has some white/yellow vaginal discharge x 4-5 days. She has vaginal/vulvar pruritus, and a mild odor. She is sexually active with one female partner and recently found out he is not monogamous. She was treated for a UTI by PCP 2 weeks ago.   Denies h/o STIs or abnormal pap smears.  Last mammogram: March 2021, results pending.   Last colonoscopy: Feb 2021, benign polyps. Due again 3 yrs.   Thinks she had DEXA scan done, too with PCP.       Past Medical History:   Diagnosis Date   ??? Arthritis    ??? Hepatitis C    ??? Hypertension    ??? Vitamin D deficiency        Past Surgical History:   Procedure Laterality Date   ??? HX CATARACT REMOVAL Bilateral    ??? HX CESAREAN SECTION     ??? HX CYST REMOVAL      Cyst removed ofrom upper chest area   ??? HX HYSTERECTOMY       h/o abdominal hysterectomy for uterine fibroids and pelvic pain at age 66       Family History   Problem Relation Age of Onset   ??? Hypertension Mother    ??? Asthma Mother    ??? Breast Cancer Mother 38   ??? Hypertension Sister    ??? Colon Cancer Sister 24   ??? Hypertension Brother    ??? Hypertension Maternal Aunt    ??? Breast Cancer Maternal Aunt 42       Social History     Socioeconomic History   ??? Marital status: SINGLE     Spouse name: Not on file   ??? Number of children: Not on file   ??? Years of education: Not on file   ??? Highest education level: 10th grade   Occupational History   ??? Not on file   Social Needs   ??? Financial resource strain: Not on file   ??? Food insecurity     Worry: Not on file     Inability: Not on file   ??? Transportation needs     Medical: Not on file     Non-medical: Not on file   Tobacco Use   ??? Smoking status: Current Every Day Smoker     Packs/day: 0.25     Years: 40.00      Pack years: 10.00     Types: Cigarettes   ??? Smokeless tobacco: Never Used   Substance and Sexual Activity   ??? Alcohol use: Not Currently   ??? Drug use: Never   ??? Sexual activity: Yes     Partners: Male     Birth control/protection: Surgical     Comment: Hysterectomy   Lifestyle   ??? Physical activity     Days per week: 7 days     Minutes per session: 10 min   ??? Stress: Not on file   Relationships   ??? Social Wellsite geologist on phone: Not on file     Gets together: Not on file     Attends religious service: Not on file     Active member  of club or organization: Not on file     Attends meetings of clubs or organizations: Not on file     Relationship status: Not on file   ??? Intimate partner violence     Fear of current or ex partner: Not on file     Emotionally abused: Not on file     Physically abused: Not on file     Forced sexual activity: Not on file   Other Topics Concern   ??? Not on file   Social History Narrative   ??? Not on file       Review of Systems: Denies issues with eyes, ears, nose. Restricted mouth opening? Denies fevers/chills, significant weight loss/gain. Denies chest pain, shortness of breath, nausea, vomiting, constipation, diarrhea or abdominal pain.  Denies dysuria.  Denies muscle aches, weakness, numbness or tingling. Denies issues with breasts. Denies bleeding/clotting d/o's. Denies anxiety/depression, S/HI.     OBJECTIVE:  BP 124/78 (BP 1 Location: Left upper arm, BP Patient Position: Sitting, BP Cuff Size: Adult)    Pulse 69    Ht 5' 1.5" (1.562 m)    Wt 168 lb (76.2 kg)    SpO2 98%    BMI 31.23 kg/m??      Constitutional  ?? Appearance: well-nourished, well developed, alert, in no acute distress    HENT  ?? Head and Face: appears normal    Neck  ?? Inspection/Palpation: normal appearance      Breasts  ??? Symmetric, no palpable masses, no tenderness, no skin changes, no nipple abnormality, no nipple discharge, no axillary or supraclavicular lymphadenopathy.    Chest  ?? Respiratory Effort:  normal      Gastrointestinal  ?? Abdominal Examination: abdomen non-tender to palpation, no masses present  ?? Liver and spleen: no hepatomegaly present, spleen not palpable      Genitourinary  ?? External Genitalia: normal appearance for age, no discharge present, no tenderness present, no inflammatory lesions present, no masses present; atrophy present  ?? Vagina: normal vaginal vault without central or paravaginal defects, no inflammatory lesions present, no masses present; thick white homogenous discharge noted in vault; swab obtained  ?? Bladder: non-tender to palpation  ?? Urethra: appears normal  ?? Cervix: absent  ?? Uterus: absent  ?? Adnexa: no adnexal tenderness present, no adnexal masses present  ?? Perineum: perineum within normal limits, no evidence of trauma, no rashes or skin lesions present  ?? Anus: anus within normal limits, no hemorrhoids present    Skin  ?? General Inspection: no rash, no lesions identified    Neurologic/Psychiatric  ?? Mental Status:  ?? Orientation: grossly oriented to person, place and time  ?? Mood and Affect: mood normal, affect appropriate        Results for orders placed or performed in visit on 05/07/19   AMB POC SMEAR, STAIN & INTERPRET, WET MOUNT   Result Value Ref Range    Wet mount (POC)      Narrative    Wet mount: +clue cells, +yeast, neg Rikki Spearing prep: +yeast, neg whiff      Impression    BV, and yeast vaginitis       Assessment/plan:    ICD-10-CM ICD-9-CM    1. Encounter for gynecological examination without abnormal finding  Z01.419 V72.31    2. Venereal disease screening  Z11.3 V74.5 HIV 1/2 AG/AB, 4TH GENERATION,W RFLX CONFIRM      RPR      HSV1/HSV2(IGG/M)      CT/NG/T.VAGINALIS  AMPLIFICATION   3. Encounter for screening mammogram for malignant neoplasm of breast  Z12.31 V76.12    4. Screening for colon cancer  Z12.11 V76.51    5. Tobacco user  Z72.0 305.1    6. Vaginal discharge  N89.8 623.5 metroNIDAZOLE (METROGEL) 0.75 % gel      fluconazole (DIFLUCAN) 150 mg tablet       AMB POC SMEAR, STAIN & INTERPRET, WET MOUNT      CT/NG/T.VAGINALIS AMPLIFICATION   7. Bacterial vaginosis  N76.0 616.10 metroNIDAZOLE (METROGEL) 0.75 % gel    B96.89 041.9    8. Yeast infection of the vagina  B37.3 112.1 fluconazole (DIFLUCAN) 150 mg tablet        -Annual gynecologic exam.    -Cervical cancer screening-  status post total hysterectomy and w/o h/o cin 2/3 or greater so no need for further pap smears.    -Breast cancer screening- breast awareness discussed; mammogram yearly.     -STI screening-accepts testing: G/C/T, HIV, RPR, HSV ordered.     -Colon cancer screening- colonoscopy up to date per pt.     -Bone health-discussed weightbearing exercises, vitamin D and calcium supplementation.    -BV and yeast vaginitis- metrogel and fluconazole rx sent.     -Discussed tobacco cessation.

## 2019-05-07 NOTE — Progress Notes (Signed)
pls let her know GCT neg. We do not have labwork back yet

## 2019-05-07 NOTE — Patient Instructions (Addendum)
Stopping Smoking: Care Instructions  Your Care Instructions     Cigarette smokers crave the nicotine in cigarettes. Giving it up is much harder than simply changing a habit. Your body has to stop craving the nicotine. It is hard to quit, but you can do it. There are many tools that people use to quit smoking. You may find that combining tools works best for you.  There are several steps to quitting. First you get ready to quit. Then you get support to help you. After that, you learn new skills and behaviors to become a nonsmoker. For many people, a necessary step is getting and using medicine.  Your doctor will help you set up the plan that best meets your needs. You may want to attend a smoking cessation program to help you quit smoking. When you choose a program, look for one that has proven success. Ask your doctor for ideas. You will greatly increase your chances of success if you take medicine as well as get counseling or join a cessation program.  Some of the changes you feel when you first quit tobacco are uncomfortable. Your body will miss the nicotine at first, and you may feel short-tempered and grumpy. You may have trouble sleeping or concentrating. Medicine can help you deal with these symptoms. You may struggle with changing your smoking habits and rituals. The last step is the tricky one: Be prepared for the smoking urge to continue for a time. This is a lot to deal with, but keep at it. You will feel better.  Follow-up care is a key part of your treatment and safety. Be sure to make and go to all appointments, and call your doctor if you are having problems. It's also a good idea to know your test results and keep a list of the medicines you take.  How can you care for yourself at home?  ?? Ask your family, friends, and coworkers for support. You have a better chance of quitting if you have help and support.  ?? Join a support group, such as Nicotine Anonymous, for people who are trying to quit  smoking.  ?? Consider signing up for a smoking cessation program, such as the American Lung Association's Freedom from Smoking program.  ?? Get text messaging support. Go to the website at www.smokefree.gov to sign up for the [facility] program.  ?? Set a quit date. Pick your date carefully so that it is not right in the middle of a big deadline or stressful time. Once you quit, do not even take a puff. Get rid of all ashtrays and lighters after your last cigarette. Clean your house and your clothes so that they do not smell of smoke.  ?? Learn how to be a nonsmoker. Think about ways you can avoid those things that make you reach for a cigarette.  ? Avoid situations that put you at greatest risk for smoking. For some people, it is hard to have a drink with friends without smoking. For others, they might skip a coffee break with coworkers who smoke.  ? Change your daily routine. Take a different route to work or eat a meal in a different place.  ?? Cut down on stress. Calm yourself or release tension by doing an activity you enjoy, such as reading a book, taking a hot bath, or gardening.  ?? Talk to your doctor or pharmacist about nicotine replacement therapy, which replaces the nicotine in your body. You still get nicotine but you do  not use tobacco. Nicotine replacement products help you slowly reduce the amount of nicotine you need. These products come in several forms, many of them available over-the-counter:  ? Nicotine patches  ? Nicotine gum and lozenges  ? Nicotine inhaler  ?? Ask your doctor about bupropion (Wellbutrin) or varenicline (Chantix), which are prescription medicines. They do not contain nicotine. They help you by reducing withdrawal symptoms, such as stress and anxiety.  ?? Some people find hypnosis, acupuncture, and massage helpful for ending the smoking habit.  ?? Eat a healthy diet and get regular exercise. Having healthy habits will help your body move past its craving for nicotine.  ?? Be prepared  to keep trying. Most people are not successful the first few times they try to quit. Do not get mad at yourself if you smoke again. Make a list of things you learned and think about when you want to try again, such as next week, next month, or next year.  Where can you learn more?  Go to [employer].be  Enter [id number] in the search box to learn more about "Stopping Smoking: Care Instructions."  Current as of: May 01, 2018??????????????????????????????Content Version: 12.6  ?? 2006-2020 Healthwise, Inc[employer].   Care instructions adapted under license by [employer] (which disclaims liability or warranty for this information). If you have questions about a medical condition or this instruction, always ask your healthcare professional. Healthwise, Incorporated disclaims any warranty or liability for your use of this information.         Preventing Osteoporosis: Care Instructions  Your Care Instructions     Osteoporosis means the bones are weak and thin enough that they can break easily. The older you are, the more likely you are to get osteoporosis. But with plenty of calcium, vitamin D, and exercise, you can help prevent osteoporosis.  The preteen and teen years are a key time for bone building. With the help of calcium, vitamin D, and exercise in those early years and beyond, the bones reach their peak density and strength by age 63. After age 66, your bones naturally start to thin and weaken.  The stronger your bones are at around age 40, the lower your risk for osteoporosis. But no matter what your age and risk are, your bones still need calcium, vitamin D, and exercise to stay strong. Also avoid smoking, and limit alcohol. Smoking and heavy alcohol use can make your bones thinner.  Talk to your doctor about any special risks you might have, such as having a close relative with osteoporosis or taking a medicine that can weaken bones. Your doctor can tell you the best ways to protect your  bones from thinning.  Follow-up care is a key part of your treatment and safety. Be sure to make and go to all appointments, and call your doctor if you are having problems. It's also a good idea to know your test results and keep a list of the medicines you take.  How can you care for yourself at home?  ?? Get enough calcium and vitamin D. The Institute of Medicine recommends adults younger than age 29 need 1,000 mg of calcium and 600 IU of vitamin D each day. Women ages 70 to 62 need 1,200 mg of calcium and 600 IU of vitamin D each day. Men ages 70 to 35 need 1,000 mg of calcium and 600 IU of vitamin D each day. Adults 71 and older need 1,200 mg of calcium and 800 IU of vitamin D  each day.  ? Eat foods rich in calcium, like yogurt, cheese, milk, and dark green vegetables.  ? Eat foods rich in vitamin D, like eggs, fatty fish, cereal, and fortified milk.  ? Get some sunshine. Your body uses sunshine to make its own vitamin D. The safest time to be out in the sun is before 10 a.m. or after 3 p.m. Avoid getting sunburned. Sunburn can increase your risk of skin cancer.  ? Talk to your doctor about taking a calcium plus vitamin D supplement if you don't think you are getting enough through your diet and sunshine. Ask about what type of calcium is right for you, and how much to take at a time. Adults ages 19 to 50 should not get more than 2,500 mg of calcium and 4,000 IU of vitamin D each day, whether it is from supplements and/or food. Adults ages 51 and older should not get more than 2,000 mg of calcium and 4,000 IU of vitamin D each day from supplements and/or food.  ?? Get regular bone-building exercise. Weight-bearing and resistance exercises keep bones healthy by working the muscles and bones against gravity. Start out at an exercise level that feels right for you. Add a little at a time until you can do the following:  ? Do 30 minutes of weight-bearing exercise on most days of the week. Walking, jogging, stair  climbing, and dancing are good choices.  ? Do resistance exercises with weights or elastic bands 2 to 3 days a week.  ?? Limit alcohol. Drink no more than 1 alcohol drink a day if you are a woman. Drink no more than 2 alcohol drinks a day if you are a man.  ?? Do not smoke. Smoking can make bones thin faster. If you need help quitting, talk to your doctor about stop-smoking programs and medicines. These can increase your chances of quitting for good.  When should you call for help?  Watch closely for changes in your health, and be sure to contact your doctor if you have any problems.  Where can you learn more?  Go to https://www.healthwise.net/[employer]  Enter S618 in the search box to learn more about "Preventing Osteoporosis: Care Instructions."  Current as of: June 04, 2018??????????????????????????????Content Version: 12.6  ?? 2006-2020 Healthwise, Inc[employer].   Care instructions adapted under license by [employer] (which disclaims liability or warranty for this information). If you have questions about a medical condition or this instruction, always ask your healthcare professional. Healthwise, Incorporated disclaims any warranty or liability for your use of this information.

## 2019-05-07 NOTE — Progress Notes (Signed)
HPI: Tracy Jordan is a 69 y.o. female, [id number], h/o abdominal hysterectomy for uterine fibroids and pelvic pain at age 41, who presents today for the following:  Chief Complaint   Patient presents with   ??? Gyn Exam   ??? New Patient        She denies abnormal vaginal bleeding. She has some white/yellow vaginal discharge x 4-5 days. She has vaginal/vulvar pruritus, and a mild odor. She is sexually active with one female partner and recently found out he is not monogamous. She was treated for a UTI by PCP 2 weeks ago.   Denies h/o STIs or abnormal pap smears.  Last mammogram: March 2021, results pending.   Last colonoscopy: Feb 2021, benign polyps. Due again 3 yrs.   Thinks she had DEXA scan done, too with PCP.       Past Medical History:   Diagnosis Date   ??? Arthritis    ??? Hepatitis C    ??? Hypertension    ??? Vitamin D deficiency        Past Surgical History:   Procedure Laterality Date   ??? HX CATARACT REMOVAL Bilateral    ??? HX CESAREAN SECTION     ??? HX CYST REMOVAL      Cyst removed ofrom upper chest area   ??? HX HYSTERECTOMY       h/o abdominal hysterectomy for uterine fibroids and pelvic pain at age 55       Family History   Problem Relation Age of Onset   ??? Hypertension Mother    ??? Asthma Mother    ??? Breast Cancer Mother 75   ??? Hypertension Sister    ??? Colon Cancer Sister 43   ??? Hypertension Brother    ??? Hypertension Maternal Aunt    ??? Breast Cancer Maternal Aunt 79       Social History     Socioeconomic History   ??? Marital status: SINGLE     Spouse name: Not on file   ??? Number of children: Not on file   ??? Years of education: Not on file   ??? Highest education level: 10th grade   Occupational History   ??? Not on file   Social Needs   ??? Financial resource strain: Not on file   ??? Food insecurity     Worry: Not on file     Inability: Not on file   ??? Transportation needs     Medical: Not on file     Non-medical: Not on file   Tobacco Use   ??? Smoking status: Current Every Day Smoker     Packs/day: 0.25     Years: 40.00      Pack years: 10.00     Types: Cigarettes   ??? Smokeless tobacco: Never Used   Substance and Sexual Activity   ??? Alcohol use: Not Currently   ??? Drug use: Never   ??? Sexual activity: Yes     Partners: Male     Birth control/protection: Surgical     Comment: Hysterectomy   Lifestyle   ??? Physical activity     Days per week: 7 days     Minutes per session: 10 min   ??? Stress: Not on file   Relationships   ??? Social Wellsite geologist on phone: Not on file     Gets together: Not on file     Attends religious service: Not on file     Active member  of club or organization: Not on file     Attends meetings of clubs or organizations: Not on file     Relationship status: Not on file   ??? Intimate partner violence     Fear of current or ex partner: Not on file     Emotionally abused: Not on file     Physically abused: Not on file     Forced sexual activity: Not on file   Other Topics Concern   ??? Not on file   Social History Narrative   ??? Not on file       Review of Systems: Denies issues with eyes, ears, nose. Restricted mouth opening? Denies fevers/chills, significant weight loss/gain. Denies chest pain, shortness of breath, nausea, vomiting, constipation, diarrhea or abdominal pain.  Denies dysuria.  Denies muscle aches, weakness, numbness or tingling. Denies issues with breasts. Denies bleeding/clotting d/o's. Denies anxiety/depression, S/HI.     OBJECTIVE:  BP 124/78 (BP 1 Location: Left upper arm, BP Patient Position: Sitting, BP Cuff Size: Adult)    Pulse 69    Ht 5' 1.5" (1.562 m)    Wt 168 lb (76.2 kg)    SpO2 98%    BMI 31.23 kg/m??      Constitutional  ?? Appearance: well-nourished, well developed, alert, in no acute distress    HENT  ?? Head and Face: appears normal    Neck  ?? Inspection/Palpation: normal appearance      Breasts  ??? Symmetric, no palpable masses, no tenderness, no skin changes, no nipple abnormality, no nipple discharge, no axillary or supraclavicular lymphadenopathy.    Chest  ?? Respiratory Effort: normal       Gastrointestinal  ?? Abdominal Examination: abdomen non-tender to palpation, no masses present  ?? Liver and spleen: no hepatomegaly present, spleen not palpable      Genitourinary  ?? External Genitalia: normal appearance for age, no discharge present, no tenderness present, no inflammatory lesions present, no masses present; atrophy present  ?? Vagina: normal vaginal vault without central or paravaginal defects, no inflammatory lesions present, no masses present; thick white homogenous discharge noted in vault; swab obtained  ?? Bladder: non-tender to palpation  ?? Urethra: appears normal  ?? Cervix: absent  ?? Uterus: absent  ?? Adnexa: no adnexal tenderness present, no adnexal masses present  ?? Perineum: perineum within normal limits, no evidence of trauma, no rashes or skin lesions present  ?? Anus: anus within normal limits, no hemorrhoids present    Skin  ?? General Inspection: no rash, no lesions identified    Neurologic/Psychiatric  ?? Mental Status:  ?? Orientation: grossly oriented to person, place and time  ?? Mood and Affect: mood normal, affect appropriate        Results for orders placed or performed in visit on 05/07/19   AMB POC SMEAR, STAIN & INTERPRET, WET MOUNT   Result Value Ref Range    Wet mount (POC)      Narrative    Wet mount: +clue cells, +yeast, neg Rikki Spearing prep: +yeast, neg whiff      Impression    BV, and yeast vaginitis       Assessment/plan:    ICD-10-CM ICD-9-CM    1. Encounter for gynecological examination without abnormal finding  Z01.419 V72.31    2. Venereal disease screening  Z11.3 V74.5 HIV 1/2 AG/AB, 4TH GENERATION,W RFLX CONFIRM      RPR      HSV1/HSV2(IGG/M)      CT/NG/T.VAGINALIS  AMPLIFICATION   3. Encounter for screening mammogram for malignant neoplasm of breast  Z12.31 V76.12    4. Screening for colon cancer  Z12.11 V76.51    5. Tobacco user  Z72.0 305.1    6. Vaginal discharge  N89.8 623.5 metroNIDAZOLE (METROGEL) 0.75 % gel      fluconazole (DIFLUCAN) 150 mg tablet      AMB  POC SMEAR, STAIN & INTERPRET, WET MOUNT      CT/NG/T.VAGINALIS AMPLIFICATION   7. Bacterial vaginosis  N76.0 616.10 metroNIDAZOLE (METROGEL) 0.75 % gel    B96.89 041.9    8. Yeast infection of the vagina  B37.3 112.1 fluconazole (DIFLUCAN) 150 mg tablet        -Annual gynecologic exam.    -Cervical cancer screening-  status post total hysterectomy and w/o h/o cin 2/3 or greater so no need for further pap smears.    -Breast cancer screening- breast awareness discussed; mammogram yearly.     -STI screening-accepts testing: G/C/T, HIV, RPR, HSV ordered.     -Colon cancer screening- colonoscopy up to date per pt.     -Bone health-discussed weightbearing exercises, vitamin D and calcium supplementation.    -BV and yeast vaginitis- metrogel and fluconazole rx sent.     -Discussed tobacco cessation.

## 2019-05-07 NOTE — Progress Notes (Signed)
pls let her know GCT neg. We do not have labwork back yet

## 2019-05-09 LAB — CT/NG/T.VAGINALIS AMPLIFICATION
C. trachomatis by NAA: NEGATIVE
CHLAMYDIA BY NAA, 183161: NEGATIVE
GONOCOCCUS BY NAA, 183162: NEGATIVE
N. gonorrhoeae by NAA: NEGATIVE
T. vaginalis by NAA: NEGATIVE
TRICH VAG BY NAA: NEGATIVE

## 2019-11-25 LAB — CHOLESTEROL, TOTAL
TOTAL CHOLESTEROL, NCHOLT: 199 NA
Total cholesterol: 199

## 2019-11-25 LAB — AMB EXT CREATININE
Creatinine, External: 0.82
Creatinine, External: 0.82 NA

## 2019-11-25 LAB — AMB EXT LDL-C
LDL-C, External: 119
LDL-C, External: 119 NA

## 2021-02-07 ENCOUNTER — Encounter: Attending: Internal Medicine | Primary: Internal Medicine

## 2021-02-22 ENCOUNTER — Ambulatory Visit: Admit: 2021-02-22 | Discharge: 2021-02-22 | Attending: Internal Medicine | Primary: Internal Medicine

## 2021-02-22 MED ORDER — CYCLOBENZAPRINE 5 MG TAB
5 mg | ORAL_TABLET | Freq: Every evening | ORAL | 0 refills | Status: AC
Start: 2021-02-22 — End: 2021-05-25

## 2021-02-22 NOTE — Progress Notes (Signed)
[facility]  [address]  [place], [place] [address]  Phone: [phone]      Tracy Jordan (DOB: January 21, 1951) is a 71 y.o. female, established patient, here for evaluation of the following chief complaint(s):  Weight Loss         SUBJECTIVE/OBJECTIVE:  HPI:  Tracy Jordan is being seen today for weight loss. She states she has lost almost 20 lbs recently. She states her appetite is ok. She does get occasional abdominal pain. She denies any issues with nausea or vomiting. She does have issues with constipation. She is unsure if she is due for colonoscopy.  Last colonoscopy 2021.  Patient states she is also having back pain.  Her hepatitis C has not been treated.  She does not need any refills at this time.  States still smoking.  One of her children had a stroke recently at the age of 62.  1 pack cigarettes last 2 or 3 days.    Prior to Admission medications    Medication Sig Start Date End Date Taking? Authorizing Provider   cyclobenzaprine (FLEXERIL) 5 mg tablet Take 1 Tablet by mouth nightly. 02/22/21  Yes Grete Bosko, Bobby Rumpf, MD   amLODIPine (NORVASC) 5 mg tablet TAKE ONE TABLET BY MOUTH AT BEDTIME 04/17/19  Yes Provider, Historical   lisinopril-hydroCHLOROthiazide (PRINZIDE, ZESTORETIC) 10-12.5 mg per tablet TAKE ONE TABLET BY MOUTH EVERY DAY 02/26/19  Yes Provider, Historical        Allergies   Allergen Reactions    Morphine Itching    Percocet [Oxycodone-Acetaminophen] Itching and Nausea and Vomiting        Past Medical History:   Diagnosis Date    Arthritis     Chronic pain     Hepatitis C     Hypercholesterolemia     Hypertension     Vitamin D deficiency         Family History   Problem Relation Age of Onset    Hypertension Mother     Asthma Mother     Breast Cancer Mother 61    Hypertension Sister     Colon Cancer Sister 27    Hypertension Brother     Hypertension Maternal Aunt     Breast Cancer Maternal Aunt 49        Past Surgical History:   Procedure  Laterality Date    HX CATARACT REMOVAL Bilateral     HX CESAREAN SECTION      HX CYST REMOVAL      Cyst removed ofrom upper chest area    HX HYSTERECTOMY       h/o abdominal hysterectomy for uterine fibroids and pelvic pain at age 63    HX ROTATOR CUFF REPAIR Left        Review of Systems   Constitutional:  Positive for unexpected weight change. Negative for chills and fever.   HENT:  Negative for congestion, ear pain, nosebleeds, sinus pain, sore throat and tinnitus.    Eyes:  Negative for redness.   Respiratory:  Negative for cough and shortness of breath.    Cardiovascular:  Negative for chest pain and palpitations.   Gastrointestinal:  Negative for abdominal pain, diarrhea, nausea and vomiting.   Endocrine: Negative for cold intolerance and polyuria.   Genitourinary:  Negative for dysuria and hematuria.   Musculoskeletal:  Positive for back pain. Negative for neck pain.   Skin:  Negative for rash.   Neurological:  Negative for dizziness and headaches.  Psychiatric/Behavioral: Negative.       BP (!) 132/90 (BP 1 Location: Left upper arm, BP Patient Position: Sitting, BP Cuff Size: Adult)    Pulse 76    Temp 98.2 ??F (36.8 ??C) (Tympanic)    Resp 16    Ht 5' 1.5" (1.562 m)    Wt 157 lb 4 oz (71.3 kg)    SpO2 99%    BMI 29.23 kg/m??      Physical Exam  Vitals and nursing note reviewed.       ASSESSMENT/PLAN:  Below is the assessment and plan developed based on review of pertinent history, physical exam, labs, studies, and medications.  Constitutional:       General: Not in acute distress.     Appearance: Normal appearance.   HENT:      Head: Normocephalic and atraumatic.      Right Ear: External ear normal.      Left Ear: External ear normal.   Eyes:      General: No scleral icterus.     Extraocular Movements: Extraocular movements intact.      Conjunctiva/sclera: Conjunctivae normal.      Pupils: Pupils are equal, round, and reactive to light.   Cardiovascular:      Rate and Rhythm: Regular rhythm.      Heart  sounds: Normal heart sounds. No murmur heard.  Pulmonary:      Effort: Pulmonary effort is normal.      Breath sounds: Normal breath sounds. No wheezing or rales.   Abdominal:      General: Bowel sounds are normal.      Palpations: Abdomen is soft. There is no mass.      Tenderness: There is no abdominal tenderness.   Musculoskeletal:         General: Normal range of motion. Spine no tenderness. SLR -ve.     Cervical back: Neck supple.   Lymphadenopathy:      Cervical: No cervical adenopathy.   Skin:     General: Skin is warm and dry.      Findings: No rash.   Neurological:      General: No focal deficit present.      Mental Status: In good spirits    1. Primary hypertension.  Blood pressure is mildly elevated today.  Patient states she is nervous.  Continue current medications.  Advised low-salt diet  -     METABOLIC PANEL, COMPREHENSIVE  -     URINALYSIS W/ REFLEX CULTURE  2. Hypercholesterolemia.  Need to recheck.  -     LIPID PANEL  -     TSH 3RD GENERATION  3. Weight loss, unintentional.  Etiology of weight loss unclear.  Patient's last colonoscopy 2021.  We will get CT of abdomen and pelvics since patient has history of chronic hepatitis C.  -     CT ABD W CONT AND PELVIS W WO CONT; Future  -     CBC WITH AUTOMATED DIFF  4. Chronic hepatitis C without hepatic coma (HCC)  -     HEPATITIS C QT BY PCR WITH REFLEX GENOTYPE; Future  5. Chronic bilateral low back pain without sciatica.  We will place her on Flexeril at night.  6. Screening mammogram for breast cancer  -     MAM MAMMOGRAM SCREENING BILATERAL; Future  7. Post-menopausal  -     DEXA BONE DENSITY STUDY AXIAL; Future  8. Tobacco user.  Advised to stop smoking.  9. Hyperthyroidism  -  T3, FREE  -     T4, FREE    No follow-ups on file.    There are no Patient Instructions on file for this visit.     Health Maintenance Due   Topic Date Due    DTaP/Tdap/Td series (1 - Tdap) Never done    COVID-19 Vaccine (4 - Booster for Pfizer series) 03/07/2020     Breast Cancer Screen Mammogram  05/03/2020    Depression Screen  05/06/2020        Aspects of this note may have been generated using voice recognition software. Despite editing, there may be unrecognized errors.    An electronic signature was used to authenticate this note.  -- Jaquita Rector, MD

## 2021-02-22 NOTE — Progress Notes (Signed)
Chief Complaint   Patient presents with    Weight Loss     1. Have you been to the ER, urgent care clinic since your last visit?  Hospitalized since your last visit?No    2. Have you seen or consulted any other health care providers outside of the [facility] since your last visit?  Include any pap smears or colon screening. No

## 2021-02-24 ENCOUNTER — Encounter

## 2021-02-26 MED ORDER — LISINOPRIL-HYDROCHLOROTHIAZIDE 10 MG-12.5 MG TAB
ORAL_TABLET | ORAL | 1 refills | Status: DC
Start: 2021-02-26 — End: 2021-05-18

## 2021-03-02 LAB — AMB EXT CREATININE
Creatinine, External: 0.89
Creatinine, External: 0.89 NA

## 2021-03-17 ENCOUNTER — Encounter

## 2021-03-17 ENCOUNTER — Inpatient Hospital Stay: Admit: 2021-03-17 | Payer: BLUE CROSS/BLUE SHIELD | Primary: Internal Medicine

## 2021-03-17 DIAGNOSIS — R634 Abnormal weight loss: Secondary | ICD-10-CM

## 2021-03-17 LAB — AMB POC CREATININE
POC Creatinine: 0.8 mg/dL (ref 0.6–1.3)
eGFR, POC: >60 ml/min/1.73m2 (ref >60)

## 2021-03-17 LAB — CREATININE, POC
Creatinine (POC): 0.8 mg/dL (ref 0.6–1.3)
eGFR (POC): >60 mL/min/{1.73_m2} (ref >60)

## 2021-03-17 MED ORDER — IOPAMIDOL 76 % IV SOLN
370 mg iodine /mL (76 %) | Freq: Once | INTRAVENOUS | Status: AC
Start: 2021-03-17 — End: 2021-03-17
  Administered 2021-03-17: 20:00:00 via INTRAVENOUS

## 2021-03-17 MED FILL — ISOVUE-370  76 % INTRAVENOUS SOLUTION: 370 mg iodine /mL (76 %) | INTRAVENOUS | Qty: 100

## 2021-03-17 NOTE — Progress Notes (Signed)
Left message.  MRI abdomen ordered.

## 2021-03-18 ENCOUNTER — Encounter

## 2021-03-22 ENCOUNTER — Encounter

## 2021-03-27 ENCOUNTER — Inpatient Hospital Stay: Payer: BLUE CROSS/BLUE SHIELD | Primary: Internal Medicine

## 2021-03-29 ENCOUNTER — Inpatient Hospital Stay: Payer: MEDICARE | Primary: Internal Medicine

## 2021-03-29 DIAGNOSIS — Z780 Asymptomatic menopausal state: Secondary | ICD-10-CM

## 2021-03-30 ENCOUNTER — Ambulatory Visit: Attending: Internal Medicine | Primary: Internal Medicine

## 2021-03-30 ENCOUNTER — Encounter: Admit: 2021-03-30 | Discharge: 2021-03-30 | Payer: MEDICARE | Attending: Internal Medicine | Primary: Internal Medicine

## 2021-03-30 DIAGNOSIS — K769 Liver disease, unspecified: Secondary | ICD-10-CM

## 2021-03-30 MED ORDER — POLYETHYLENE GLYCOL 3350 17 GRAM (100 %) ORAL POWDER PACKET
17 gram | Freq: Every day | ORAL | 1 refills | Status: AC
Start: 2021-03-30 — End: ?

## 2021-03-30 NOTE — Progress Notes (Signed)
 1. Have you been to the ER, urgent care clinic since your last visit?  Hospitalized since your last visit? No    2. Have you seen or consulted any other health care providers outside of the [facility] since your last visit? Yes Where: CT scan- [facility]      3. For patients aged 69-75: Has the patient had a colonoscopy / FIT/ Cologuard? Yes - no Care Gap present      If the patient is female:    4. For patients aged 16-74: Has the patient had a mammogram within the past 2 years? No

## 2021-03-30 NOTE — Progress Notes (Signed)
[facility]  [address]  [place], [place] [address]  Phone: [phone]      Tracy Jordan (DOB: Jun 22, 1950) is a 71 y.o. female, established patient, here for evaluation of the following chief complaint(s):  No chief complaint on file.         SUBJECTIVE/OBJECTIVE:  HPI:  Tracy Jordan is being seen today for follow up. She checks her BP at home sometimes. She denies any issues with chest pain, SOB, or headaches.  She had CT scan done recently at [facility]. She was scheduled for bone density however she was unable to do that due to the dye from the CT scan still being present.  She has not scheduled her mammogram yet.  She does not need any refills at this time.  Patient had blood work done on 03/02/2021 white count was 4.8.  Hemoglobin 13.1.  Platelet count 297.  Urine analysis showed trace protein.  Small bilirubin.  No leukocytes.  Total cholesterol was 211.  Triglyceride 96.  HDL 74.  LDL 118.  LFTs within normal limit.  Albumin 3.8.  Potassium 4.  BUN 11.  Creatinine 0.89.  TSH 0.255.  T32.71.  T41.14.Marland Kitchen  Patient had a CT of abdomen which showed multiple small foci of enhancement in liver.  Most likely benign hemangiomas.  However history of weight loss recommend MRI of abdomen.  Large stool burden throughout entire colon MRI has been ordered.  Pending.  Patient has history of hepatitis C.  Patient states she never took treatment even the treatment was ordered for her.  Prior to Admission medications    Medication Sig Start Date End Date Taking? Authorizing Provider   polyethylene glycol (MIRALAX) 17 gram packet Take 1 Packet by mouth daily. 03/30/21  Yes Kyasia Steuck, Bobby Rumpf, MD   lisinopril-hydroCHLOROthiazide (PRINZIDE, ZESTORETIC) 10-12.5 mg per tablet TAKE ONE TABLET BY MOUTH DAILY 02/26/21  Yes Brina Umeda, Bobby Rumpf, MD   cyclobenzaprine (FLEXERIL) 5 mg tablet Take 1 Tablet by mouth nightly. 02/22/21  Yes Banita Lehn, Bobby Rumpf, MD   amLODIPine (NORVASC) 5 mg  tablet TAKE ONE TABLET BY MOUTH AT BEDTIME 04/17/19  Yes Provider, Historical        Allergies   Allergen Reactions    Morphine Itching    Percocet [Oxycodone-Acetaminophen] Itching and Nausea and Vomiting        Past Medical History:   Diagnosis Date    Arthritis     Chronic back pain greater than 3 months duration     Chronic pain     Decreased thyroid stimulating hormone level     Hepatitis C     Hypercholesterolemia     Hypertension     Vitamin D deficiency         Family History   Problem Relation Age of Onset    Hypertension Mother     Asthma Mother     Breast Cancer Mother 55    Hypertension Sister     Colon Cancer Sister 32    Hypertension Brother     Hypertension Maternal Aunt     Breast Cancer Maternal Aunt 47        Past Surgical History:   Procedure Laterality Date    HX CATARACT REMOVAL Bilateral     HX CESAREAN SECTION      HX CYST REMOVAL      Cyst removed ofrom upper chest area    HX HYSTERECTOMY       h/o abdominal hysterectomy for  uterine fibroids and pelvic pain at age 40    HX ROTATOR CUFF REPAIR Left        Review of Systems   Constitutional:  Negative for chills and fever.   HENT:  Negative for congestion, ear pain, nosebleeds, sinus pain, sore throat and tinnitus.    Eyes:  Negative for redness.   Respiratory:  Negative for cough and shortness of breath.    Cardiovascular:  Negative for chest pain and palpitations.   Gastrointestinal:  Negative for abdominal pain, diarrhea, nausea and vomiting.   Endocrine: Negative for cold intolerance and polyuria.   Genitourinary:  Negative for dysuria and hematuria.   Musculoskeletal:  Negative for back pain and neck pain.   Skin:  Negative for rash.   Neurological:  Negative for dizziness and headaches.   Psychiatric/Behavioral: Negative.       BP (!) 140/92 (BP 1 Location: Right upper arm, BP Patient Position: Sitting, BP Cuff Size: Adult)    Pulse 70    Temp 98.6 ??F (37 ??C) (Temporal)    Resp 16    Ht 5' 1.5" (1.562 m)    Wt 157 lb 6 oz (71.4 kg)    SpO2  98%    BMI 29.25 kg/m??      Physical Exam  Vitals and nursing note reviewed.   Gen:  Not  in no acute distress. Well built and nourished.  HEENT:  Pink conjunctivae, PERLA, EOMI, hearing intact .  Mouth: Moist mucous membranes. No TPC  Neck:  Supple, without masses, thyroid not enlarged  Resp:  No accessory muscle use, clear breath sounds without wheezes rales or rhonchi  Card:  No murmurs, normal S1, S2 without thrills, bruits or peripheral edema  Abd:  Soft, non-tender, non-distended, normoactive bowel sounds are present, no palpable organomegaly and no detectable hernias  Lymph:  No cervical or inguinal adenopathy  Musc:  No cyanosis or clubbing.   Gait: Normal  Skin:  No rashes or ulcers, skin turgor is good  Neuro: Alert and oriented x 3. Cranial nerves are grossly intact, no focal motor weakness, follows commands appropriately  Psych:  Good insight, mood normal.     ASSESSMENT/PLAN:  Below is the assessment and plan developed based on review of pertinent history, physical exam, labs, studies, and medications.    1. Liver lesion  Comments:  Most likely hemangioma.  MRI pending.  Patient has a history of hepatitis C before.  Offered hepatitis B vaccine.  Declined  2. Chronic constipation  Comments:  Advised high-fiber diet.  We will place her on MiraLAX.  3. Hyperthyroidism  Comments:  TSH is decreased, T3 and T4 normal.  Will refer her to endocrinologist since patient has weight loss  Orders:  -     REFERRAL TO ENDOCRINOLOGY  4. Hypercholesterolemia  Comments:   Cholesterol mildly elevated.  Good HDL cholesterol.  Advised low-carb diet and exercise.  5. Essential hypertension  Comments:  Blood pressure elevated today.  Patient states she missed doses of medicine couple of times this week.  Importance of taking blood pressure medications every day discussed with the patient.  6. Current every day smoker  Comments:  Advised patient to stop smoking.  7. Hepatitis C carrier (HCC).  Patient states she had never been  treated for hepatitis C.  We will refer her to hepatologist.  -     REFERRAL TO LIVER HEPATOLOGY    Return in about 4 months (around 07/28/2021) for follow up.  Patient Instructions   Patient advised to have low sodium diet, daily exercise, and increase daily fruit and vegetable intake.      Health Maintenance Due   Topic Date Due    A1C test (Diabetic or Prediabetic)  Never done    Breast Cancer Screen Mammogram  05/03/2020    Medicare Yearly Exam  03/10/2021        Aspects of this note may have been generated using voice recognition software. Despite editing, there may be unrecognized errors.    An electronic signature was used to authenticate this note.  -- Jaquita Rector, MD

## 2021-04-09 ENCOUNTER — Encounter: Primary: Internal Medicine

## 2021-04-13 ENCOUNTER — Inpatient Hospital Stay: Admit: 2021-04-13 | Payer: MEDICARE | Primary: Internal Medicine

## 2021-04-13 DIAGNOSIS — R935 Abnormal findings on diagnostic imaging of other abdominal regions, including retroperitoneum: Secondary | ICD-10-CM

## 2021-04-13 MED ORDER — GADOTERIDOL 279.3 MG/ML INTRAVENOUS SOLUTION
279.3 mg/mL | INTRAVENOUS | Status: AC
Start: 2021-04-13 — End: 2021-04-13
  Administered 2021-04-13: 21:00:00 via INTRAVENOUS

## 2021-04-13 MED ORDER — SODIUM CHLORIDE 0.9% BOLUS IV
0.9 % | Freq: Once | INTRAVENOUS | Status: AC
Start: 2021-04-13 — End: 2021-04-13
  Administered 2021-04-13: 21:00:00 via INTRAVENOUS

## 2021-04-13 MED ORDER — SODIUM CHLORIDE 0.9 % IJ SYRG
Freq: Once | INTRAMUSCULAR | Status: AC
Start: 2021-04-13 — End: 2021-04-13
  Administered 2021-04-13: 21:00:00 via INTRAVENOUS

## 2021-04-13 MED ORDER — GADOTERIDOL 279.3 MG/ML INTRAVENOUS SOLUTION
279.3 mg/mL | Freq: Once | INTRAVENOUS | Status: AC
Start: 2021-04-13 — End: 2021-04-13

## 2021-04-13 MED FILL — PROHANCE 279.3 MG/ML INTRAVENOUS SOLUTION: 279.3 mg/mL | INTRAVENOUS | Qty: 15

## 2021-04-13 NOTE — Progress Notes (Signed)
Please let patient know MRI ok. Only hemangiomas. Nothing to worry

## 2021-04-17 NOTE — Telephone Encounter (Signed)
Left msg with pt's family for pt to return my call. Tracy Jordan

## 2021-04-17 NOTE — Telephone Encounter (Signed)
-----   Message from De JAYSON Lamy, MD sent at 04/16/2021  7:54 PM EST -----  Please let patient know MRI ok. Only hemangiomas. Nothing to worry

## 2021-04-25 NOTE — Telephone Encounter (Signed)
Pt notified. Tracy Jordan

## 2021-05-17 NOTE — Telephone Encounter (Signed)
 Patient is still having weight loss and stomach discomfort.    Took the medication Dr. Glynda prescribed hasn't helped her have a bowel movement.    Tried an enema and it would not go in.    Only a very little bit of soft bowel has come out.     Requesting an appointment or other options.

## 2021-05-18 ENCOUNTER — Encounter

## 2021-05-18 NOTE — Telephone Encounter (Signed)
Patient does not have one. Could you put in a referral to who she should go see?

## 2021-05-19 MED ORDER — AMLODIPINE 5 MG TAB
5 mg | ORAL_TABLET | Freq: Every evening | ORAL | 1 refills | Status: AC
Start: 2021-05-19 — End: ?

## 2021-05-19 MED ORDER — LISINOPRIL-HYDROCHLOROTHIAZIDE 10 MG-12.5 MG TAB
ORAL_TABLET | Freq: Every day | ORAL | 1 refills | Status: AC
Start: 2021-05-19 — End: ?

## 2021-05-24 NOTE — Telephone Encounter (Signed)
Patient has been informed

## 2021-05-24 NOTE — Telephone Encounter (Signed)
Patient will call Dr. Ludwig Clarks today to make appointment.

## 2021-05-25 ENCOUNTER — Ambulatory Visit: Attending: Internal Medicine | Primary: Internal Medicine

## 2021-05-25 ENCOUNTER — Ambulatory Visit: Admit: 2021-05-25 | Discharge: 2021-05-25 | Payer: MEDICARE | Attending: Internal Medicine | Primary: Internal Medicine

## 2021-05-25 DIAGNOSIS — R634 Abnormal weight loss: Secondary | ICD-10-CM

## 2021-05-25 NOTE — Progress Notes (Signed)
1. "Have you been to the ER, urgent care clinic since your last visit?  Hospitalized since your last visit?" No    2. "Have you seen or consulted any other health care providers outside of the [facility] since your last visit?" Yes Where: MRI and CT scan done at [facility]      3. For patients aged 69-75: Has the patient had a colonoscopy / FIT/ Cologuard? Yes - no Care Gap present      If the patient is female:    4. For patients aged 11-74: Has the patient had a mammogram within the past 2 years? No      5. For patients aged 21-65: Has the patient had a pap smear? NA - based on age or sex

## 2021-05-25 NOTE — Progress Notes (Signed)
[facility]  [address]  [place], [place] [address]  Phone: [phone]      Tracy Jordan (DOB: 1951-01-18) is a 71 y.o. female, established patient, here for evaluation of the following chief complaint(s):  Weight Loss         SUBJECTIVE/OBJECTIVE:  HPI:  Tracy Jordan is being seen today for weight loss.  Patient significant past medical history includes hypertension, hypercholesterolemia, chronic hepatitis C and tobacco dependence.  She states she has lost 5 lbs since her last visit. She has not had any changes in her diet.  She also states she is having issues with both of her lower legs. She states the left is worse. She states it feels like a sock on her legs to the knees. She also states her feet feel like cramping.  Patient's appointment with endocrinologist is only in June.  Apparently patient has not gotten appointment with hepatologist.Patient's last blood work 03/02/2021 CMP was within normal limit.  Total cholesterol was 211.  LDL was 118.  Hepatitis C virus RNA   4 65,00000 .HCVRNA 7.667 urine analysis showed small bilirubin.  TSH was 0.255.  T3 was 2.71 T4 was 1.14 white count was 4.8.  Hemoglobin 13.1.  Platelet count 297.Patient had her colonoscopy done on 04/14/2019 1 polyp was removed.  Patient had a bone density scan 11/18/2018 which showed osteoporosis .MRI abdomen done on 04/13/2021 showed no acute intraperitoneal process.  Scattered hemangiomas are noted.  No suspicious hepatic mass lesion CT of abdomen and pelvics multiple small foci of enhancement in liver stool burden patient had a thyroid scan done on 08/06/2012 which was normal  She does not need any refills at this time.  Prior to Admission medications    Medication Sig Start Date End Date Taking? Authorizing Provider   amLODIPine (NORVASC) 5 mg tablet Take 1 Tablet by mouth nightly. 05/18/21  Yes Antonetta Clanton, Bobby Rumpf, MD   lisinopril-hydroCHLOROthiazide (PRINZIDE, ZESTORETIC) 10-12.5 mg per  tablet Take 1 Tablet by mouth daily. 05/18/21  Yes Natacha Jepsen, Bobby Rumpf, MD   polyethylene glycol (MIRALAX) 17 gram packet Take 1 Packet by mouth daily. 03/30/21  Yes Adriane Guglielmo, Bobby Rumpf, MD        Allergies   Allergen Reactions    Morphine Itching    Percocet [Oxycodone-Acetaminophen] Itching and Nausea and Vomiting        Past Medical History:   Diagnosis Date    Arthritis     Chronic back pain greater than 3 months duration     Chronic pain     Decreased thyroid stimulating hormone level     Hepatitis C     Hypercholesterolemia     Hypertension     Vitamin D deficiency         Family History   Problem Relation Age of Onset    Hypertension Mother     Asthma Mother     Breast Cancer Mother 44    Hypertension Sister     Colon Cancer Sister 55    Hypertension Brother     Hypertension Maternal Aunt     Breast Cancer Maternal Aunt 57        Past Surgical History:   Procedure Laterality Date    HX CATARACT REMOVAL Bilateral     HX CESAREAN SECTION      HX CYST REMOVAL      Cyst removed ofrom upper chest area    HX HYSTERECTOMY       h/o  abdominal hysterectomy for uterine fibroids and pelvic pain at age 49    HX ROTATOR CUFF REPAIR Left        Review of Systems   Constitutional:  Positive for unexpected weight change. Negative for chills and fever.   HENT:  Negative for congestion, ear pain, nosebleeds, sinus pain, sore throat and tinnitus.    Eyes:  Negative for redness.   Respiratory:  Negative for cough and shortness of breath.    Cardiovascular:  Negative for chest pain and palpitations.   Gastrointestinal:  Negative for abdominal pain, diarrhea, nausea and vomiting.   Endocrine: Negative for cold intolerance and polyuria.   Genitourinary:  Negative for dysuria and hematuria.   Musculoskeletal:  Negative for back pain and neck pain.        Leg cramps   Skin:  Negative for rash.   Neurological:  Negative for dizziness and headaches.   Psychiatric/Behavioral: Negative.       BP 130/88 (BP 1 Location: Left upper arm,  BP Patient Position: Sitting, BP Cuff Size: Adult)   Pulse 81   Temp 98 F (36.7 C) (Temporal)   Resp 16   Ht 5' 1.5" (1.562 m)   Wt 152 lb 4 oz (69.1 kg)   SpO2 96%   BMI 28.30 kg/m      Physical Exam  Gen:  Not  in no acute distress. Well built and nourished.  HEENT:  Pink conjunctivae, PERLA, EOMI, hearing intact .  Mouth: Moist mucous membranes. No TPC  Neck:  Supple, without masses, thyroid not enlarged  Resp:  No accessory muscle use, clear breath sounds without wheezes rales or rhonchi  Card:  No murmurs, normal S1, S2 without thrills, bruits or peripheral edema  Abd:  Soft, non-tender, non-distended, normoactive bowel sounds are present, no palpable organomegaly and no detectable hernias  Lymph:  No cervical or inguinal adenopathy  Musc:  No cyanosis or clubbing.   Gait: Normal  Skin:  No rashes or ulcers, skin turgor is good  Neuro: Alert and oriented x 3. Cranial nerves are grossly intact, no focal motor weakness, follows commands appropriately  Psych:  Good insight, mood normal.   ASSESSMENT/PLAN:  Below is the assessment and plan developed based on review of pertinent history, physical exam, labs, studies, and medications.    1. Weight loss, abnormal  Comments:  Etiology of weight loss unclear.  Patient recently had CT abdomen and MRI abdomen.  Also had recent colonoscopy.  Advised high-calorie diet.  Orders:  -     CBC WITH AUTOMATED DIFF; Future  2. Chronic hepatitis C without hepatic coma (HCC)  Comments:  Currently patient has not gotten appointment with  hepatologist.  Will make appointment with Dr. Rose Fillers.  Patient's viral load is pretty high.  Orders:  -     REFERRAL TO LIVER HEPATOLOGY  3. Essential hypertension  Comments:  Diastolic blood pressure is mildly elevated today we will continue to monitor.  Orders:  -     METABOLIC PANEL, COMPREHENSIVE; Future  4. Decreased thyroid stimulating hormone (TSH) level  Comments:  TSH is mildly decreased, T3 and T4 normal.  I am not sure her  weight loss is from hyperthyroidism since T3 and T4 are normal.  Waiting for endocrinology appointment.  We will recheck TSH T3 and T4.  Orders:  -     TSH 3RD GENERATION; Future  -     T4, FREE; Future  -     T3, FREE; Future  -  TSH RECEPTOR AB; Future  5. Hypercholesterolemia  Comments:  Advised low-cholesterol diet.  Orders:  -     LIPID PANEL; Future  6. Prediabetes  Comments:  Check with the next blood work.  Orders:  -     HEMOGLOBIN A1C WITH EAG; Future  7. Screening for lung cancer  Comments:  Patient never had a lung cancer screening done.  We will check get CT chest.  8. Tobacco dependence  -     CT LOW DOSE LUNG CANCER SCREENING; Future  9. 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  Orders:  -     DEXA BONE DENSITY STUDY AXIAL; Future      Return in about 8 weeks (around 07/20/2021).    There are no Patient Instructions on file for this visit.     Health Maintenance Due   Topic Date Due    A1C test (Diabetic or Prediabetic)  Never done    COVID-19 Vaccine (4 - Booster for Pfizer series) 03/07/2020    Breast Cancer Screen Mammogram  05/03/2020    Medicare Yearly Exam  Never done        Aspects of this note may have been generated using voice recognition software. Despite editing, there may be unrecognized errors.    An electronic signature was used to authenticate this note.  -- Jaquita RectorKochurani C Yousaf Sainato, MD

## 2021-06-08 ENCOUNTER — Inpatient Hospital Stay: Admit: 2021-06-08 | Payer: MEDICARE | Primary: Internal Medicine

## 2021-06-08 DIAGNOSIS — Z122 Encounter for screening for malignant neoplasm of respiratory organs: Secondary | ICD-10-CM

## 2021-06-08 DIAGNOSIS — F17200 Nicotine dependence, unspecified, uncomplicated: Secondary | ICD-10-CM

## 2021-06-08 DIAGNOSIS — M810 Age-related osteoporosis without current pathological fracture: Secondary | ICD-10-CM

## 2021-06-08 NOTE — Progress Notes (Signed)
Progress  Notes by Norm Salt L at 06/08/21 2359                Author: Tanya Nones  Service: --  Author Type: Medical Assistant       Filed: 06/19/21 0953  Date of Service: 06/08/21 2359  Status: Signed          Editor: Tanya Nones (Medical Assistant)               LM to call back. Erica L Bowen

## 2021-06-08 NOTE — Progress Notes (Signed)
Pt notified. Tracy Jordan

## 2021-06-08 NOTE — Progress Notes (Signed)
Pt notified. Erica L Bowen

## 2021-06-08 NOTE — Progress Notes (Signed)
Left msg with pt's family for pt to return my call.Erica L Bowen

## 2021-06-08 NOTE — Progress Notes (Signed)
Progress Notes by Jaquita Rector, MD at 06/08/21 2359                Author: Jaquita Rector, MD  Service: --  Author Type: Physician       Filed: 06/12/21 1956  Date of Service: 06/08/21 2359  Status: Signed          Editor: Jaquita Rector, MD (Physician)               Please let patient know she has a 1 cm right upper lobe nodule.  Need to repeat another CT in 1 year

## 2021-06-08 NOTE — Progress Notes (Signed)
Progress Notes by Jaquita Rector, MD at 06/08/21 2359                Author: Jaquita Rector, MD  Service: --  Author Type: Physician       Filed: 06/12/21 1958  Date of Service: 06/08/21 2359  Status: Signed          Editor: Jaquita Rector, MD (Physician)               Please let patient know bone density scan showed osteopenia.  To start on calcium and vitamin D and do weightbearing exercise.

## 2021-06-08 NOTE — Progress Notes (Signed)
LM with pt's family for pt to return my call. Erica L Bowen

## 2021-06-11 ENCOUNTER — Encounter

## 2021-06-12 ENCOUNTER — Encounter

## 2021-06-19 NOTE — Telephone Encounter (Signed)
Patient has been informed

## 2021-07-18 ENCOUNTER — Inpatient Hospital Stay: Admit: 2021-07-18 | Payer: MEDICARE | Primary: Internal Medicine

## 2021-07-18 ENCOUNTER — Encounter

## 2021-07-18 DIAGNOSIS — R634 Abnormal weight loss: Secondary | ICD-10-CM

## 2021-07-18 LAB — CHOLESTEROL, TOTAL, EXTERNAL: TOTAL CHOLESTEROL, EXTERNAL: 200

## 2021-07-18 LAB — CREATININE, EXTERNAL: Creatinine, External: 0.93

## 2021-07-18 LAB — LDL CHOLESTEROL, EXTERNAL: LDL Cholesterol, External: 117

## 2021-07-18 LAB — HEMOGLOBIN A1C, EXTERNAL: Hemoglobin A1C, External: 5.9 %

## 2021-07-19 LAB — CBC WITH AUTO DIFFERENTIAL
Basophils %: 0 %
Basophils Absolute: 0 10*3/uL (ref 0.0–0.2)
Eosinophils %: 3 %
Eosinophils Absolute: 0.2 10*3/uL (ref 0.0–0.4)
Hematocrit: 36.1 % (ref 34.0–46.6)
Hemoglobin: 12.6 g/dL (ref 11.1–15.9)
Immature Grans (Abs): 0 10*3/uL (ref 0.0–0.1)
Immature Granulocytes: 0 %
Lymphocytes %: 43 %
Lymphocytes Absolute: 2.4 10*3/uL (ref 0.7–3.1)
MCH: 31.6 pg (ref 26.6–33.0)
MCHC: 34.9 g/dL (ref 31.5–35.7)
MCV: 91 fL (ref 79–97)
Monocytes %: 6 %
Monocytes Absolute: 0.3 10*3/uL (ref 0.1–0.9)
Neutrophils %: 48 %
Neutrophils Absolute: 2.7 10*3/uL (ref 1.4–7.0)
Platelets: 275 10*3/uL (ref 150–450)
RBC: 3.99 x10E6/uL (ref 3.77–5.28)
RDW: 12.9 % (ref 11.7–15.4)
WBC: 5.7 10*3/uL (ref 3.4–10.8)

## 2021-07-19 LAB — T3, FREE: T3, Free: 3.1 pg/mL (ref 2.0–4.4)

## 2021-07-19 LAB — COMPREHENSIVE METABOLIC PANEL
ALT: 12 IU/L (ref 0–32)
AST: 20 IU/L (ref 0–40)
Albumin/Globulin Ratio: 1.4 (ref 1.2–2.2)
Albumin: 4.2 g/dL (ref 3.8–4.8)
Alkaline Phosphatase: 91 IU/L (ref 44–121)
BUN/Creatinine Ratio: 13 (ref 12–28)
BUN: 12 mg/dL (ref 8–27)
CO2: 23 mmol/L (ref 20–29)
Calcium: 9.5 mg/dL (ref 8.7–10.3)
Chloride: 104 mmol/L (ref 96–106)
Creatinine: 0.93 mg/dL (ref 0.57–1.00)
Est, Glomerular Filtration Rate: 67 mL/min/{1.73_m2} (ref >59)
Globulin, Total: 3 g/dL (ref 1.5–4.5)
Glucose: 95 mg/dL (ref 70–99)
Potassium: 4.6 mmol/L (ref 3.5–5.2)
Sodium: 141 mmol/L (ref 134–144)
Total Bilirubin: <0.2 mg/dL (ref 0.0–1.2)
Total Protein: 7.2 g/dL (ref 6.0–8.5)

## 2021-07-19 LAB — HEMOGLOBIN A1C W/EAG
Estimated Avg Glucose: 123 mg/dL
Hemoglobin A1C: 5.9 % — ABNORMAL HIGH (ref 4.8–5.6)

## 2021-07-19 LAB — LIPID PANEL
Cholesterol: 200 mg/dL — ABNORMAL HIGH (ref 100–199)
HDL: 63 mg/dL (ref >39)
LDL Calculated: 117 mg/dL — ABNORMAL HIGH (ref 0–99)
Triglycerides: 116 mg/dL (ref 0–149)
VLDL Cholesterol Calculated: 20 mg/dL (ref 5–40)

## 2021-07-19 LAB — T4, FREE: T4 Free: 1.27 ng/dL (ref 0.82–1.77)

## 2021-07-19 LAB — THYROTROPIN RECEPTOR ANTIBODY: Thyrotropin Receptor Ab: <1.1 IU/L (ref 0.00–1.75)

## 2021-07-19 LAB — TSH: TSH: 0.224 u[IU]/mL — ABNORMAL LOW (ref 0.450–4.500)

## 2021-07-20 ENCOUNTER — Ambulatory Visit: Admit: 2021-07-20 | Discharge: 2021-07-20 | Payer: MEDICARE | Attending: Internal Medicine | Primary: Internal Medicine

## 2021-07-20 ENCOUNTER — Encounter

## 2021-07-20 ENCOUNTER — Encounter: Attending: Internal Medicine | Primary: Internal Medicine

## 2021-07-20 DIAGNOSIS — R634 Abnormal weight loss: Secondary | ICD-10-CM

## 2021-07-20 MED ORDER — IBANDRONATE SODIUM 150 MG PO TABS
150 MG | ORAL_TABLET | ORAL | 3 refills | Status: AC
Start: 2021-07-20 — End: 2022-05-09

## 2021-07-20 NOTE — Progress Notes (Signed)
[facility]  [address]  [place], [place] [address]  Phone: [phone]      Tracy Jordan (DOB: June 04, 1950) is a 71 y.o. female, established patient, here for evaluation of the following chief complaint(s):  Follow-up Chronic Condition and Weight Loss         SUBJECTIVE/OBJECTIVE:  HPI:  This is a 71 year old female with past medical history of hypertension chronic hepatitis C, hypercholesterolemia here for follow-up appointment.  Patient also had blood work done.  Patient states she is still losing weight.  She has not seen a hepatologist yet.  States she has appointment with endocrinologist coming up soon and also an appointment with a gastroenterologist.  Patient's last colonoscopy was 04/14/2019 which showed 1.5 cm sessile polyp total of 3 polyps were removed.  Labs done on 07/18/2021 showed sodium 141.  Potassium 4.6.  BUN/creatinine normal.  LFTs within normal limit.  Total cholesterol 200.  HDL 63.  LDL 117.  Triglyceride 116.  Hemoglobin A1c 5.9.  T33.1.  TSH 0.224.  Free T41.27.  White count is 5.7.  Hemoglobin 12.6.  Platelet count 275.  Patient's bone density scan showed T score of -3 left radius and lumbar spine -3.4.  Patient also had a CT lung done which showed 1 cm groundglass nodule right upper lobe.  Patient had a MRI abdomen done on 04/16/2021 showed hepatic steatosis.  No acute  intraperitoneal process identified.  Scattered hemangiomas.  No suspicious liver lesions.  Prior to Admission medications    Medication Sig Start Date End Date Taking? Authorizing Provider   Calcium Carb-Cholecalciferol (CALCIUM 500 + D PO) Take by mouth   Yes Historical Provider, MD   ibandronate (BONIVA) 150 MG tablet Take 1 tablet by mouth every 30 days Take one (1) tablet once per month in the morning with a full glass of water, on an empty stomach, and do not take anything else by mouth or lie down for the next 60 minutes. 07/20/21  Yes Don Perking, MD   gabapentin  (NEURONTIN) 100 MG capsule Take 1 capsule by mouth as needed. 04/19/20  Yes Historical Provider, MD   amLODIPine (NORVASC) 5 MG tablet Take 1 tablet by mouth nightly 05/18/21  Yes Ar Automatic Reconciliation   lisinopril-hydroCHLOROthiazide (PRINZIDE;ZESTORETIC) 10-12.5 MG per tablet Take 1 tablet by mouth daily 05/18/21  Yes Ar Automatic Reconciliation        Allergies   Allergen Reactions    Morphine Itching    Oxycodone-Acetaminophen Itching and Nausea And Vomiting        Past Medical History:   Diagnosis Date    Arthritis     Chronic back pain greater than 3 months duration     Chronic pain     Decreased thyroid stimulating hormone level     Hepatitis C     Hypercholesterolemia     Hypertension     Vitamin D deficiency         Family History   Problem Relation Age of Onset    Breast Cancer Maternal Aunt 65    Colon Cancer Sister 76    Hypertension Brother     Hypertension Maternal Aunt     Hypertension Mother     Asthma Mother     Breast Cancer Mother 75    Hypertension Sister         Past Surgical History:   Procedure Laterality Date    CATARACT REMOVAL Bilateral     CESAREAN SECTION  CYST REMOVAL      Cyst removed ofrom upper chest area    HYSTERECTOMY (CERVIX STATUS UNKNOWN)       h/o abdominal hysterectomy for uterine fibroids and pelvic pain at age 42    ROTATOR CUFF REPAIR Left        Social History     Tobacco Use    Smoking status: Every Day     Packs/day: 0.25     Years: 50.00     Pack years: 12.50     Types: Cigarettes     Start date: 67     Passive exposure: Current    Smokeless tobacco: Never   Substance Use Topics    Alcohol use: Not Currently    Drug use: Never         Review of Systems   Constitutional:  Positive for unexpected weight change. Negative for appetite change and fatigue.   Eyes:  Negative for visual disturbance.   Respiratory:  Negative for cough and shortness of breath.    Cardiovascular:  Negative for chest pain and leg swelling.   Gastrointestinal:  Negative for abdominal pain,  diarrhea, nausea and vomiting.   Genitourinary:  Negative for dysuria.   Skin:  Negative for rash.   Neurological:  Negative for tremors and headaches.   Psychiatric/Behavioral:  The patient is not nervous/anxious.      BP 138/88 (Site: Left Upper Arm, Position: Sitting)   Pulse 62   Temp 96.8 F (36 C) (Temporal)   Resp 16   Ht 5' 1.5" (1.562 m)   Wt 151 lb 6.4 oz (68.7 kg)   SpO2 100%   BMI 28.14 kg/m      Physical Exam  Gen:  Not  in no acute distress. Well built and nourished.  HEENT:  Pink conjunctivae, PERLA, EOMI, hearing intact .  Mouth: Moist mucous membranes. No TPC  Neck:  Supple, without masses, thyroid not enlarged  Resp:  No accessory muscle use, clear breath sounds without wheezes rales or rhonchi  Card:  No murmurs, normal S1, S2 without thrills, bruits or peripheral edema  Abd:  Soft, non-tender, non-distended, normoactive bowel sounds are present, no palpable organomegaly and no detectable hernias  Lymph:  No cervical or inguinal adenopathy  Musc:  No cyanosis or clubbing.   Gait: Normal  Skin:  No rashes or ulcers, skin turgor is good  Neuro: Alert and oriented x 3. Cranial nerves are grossly intact, no focal motor weakness, follows commands appropriately  Psych:  Good insight, mood normal.   ASSESSMENT/PLAN:  Below is the assessment and plan developed based on review of pertinent history, physical exam, labs, studies, and medications.    1. Abnormal weight loss  Comments:  Etiology of weight loss unclear yet.  Rule out secondary to subclinical hyperthyroidism.  Advised patient to eat high-calorie diet.  2. Essential hypertension  Comments:  Diastolic blood pressure is mildly elevated.  Continue current treatment.  3. Decreased thyroid stimulating hormone (TSH) level  Comments:  Patient was appointment with endocrinologist coming up.  I am not sure whether her weight loss is because of hyperthyroidism.  4. Hypercholesterolemia  Comments:  LDL cholesterol is elevated.  Advised  low-cholesterol diet.  5. Prediabetes  Comments:  A1c is 5.9.  Patient needs to avoid sweets and soft drinks.  6. Post-menopausal osteoporosis  Comments:  Patient's T score is -3.4.  We will place her on Boniva once a month.  Also advised calcium and weightbearing exercise.  Importance  of smoking cessati discussed  Orders:  -     ibandronate (BONIVA) 150 MG tablet; Take 1 tablet by mouth every 30 days Take one (1) tablet once per month in the morning with a full glass of water, on an empty stomach, and do not take anything else by mouth or lie down for the next 60 minutes., Disp-3 tablet, R-3Normal  7. Right upper lobe pulmonary nodule  Comments:  Patient has 1 cm groundglass nodule right upper lobe.  Will make appointment with pulmonologist to see whether she needs biopsy.  Also advised to stop stop smok  Orders:  -     Amb External Referral To Pulmonology  8. Encounter for screening mammogram for breast cancer  -     MAM TOMO DIGITAL SCREEN BILATERAL [[id number]][[id number]]; Future  9. Chronic hepatitis C without hepatic coma [facility] [place] [facility](HCC)  Comments:  Patient has appointment with gastroenterologist tomorrow.      Return in about 6 weeks (around 08/31/2021) for Medicare wellness.     Patient Instructions     Advised to stop smoking.      Health Maintenance Due   Topic Date Due    Breast cancer screen  05/03/2020    Annual Wellness Visit (AWV)  07/18/2021        Aspects of this note may have been generated using voice recognition software. Despite editing, there may be unrecognized errors.    An electronic signature was used to authenticate this note.  -- Don PerkingKOCHURANI  Trivia Heffelfinger, MD     1. "Have you been to the ER, urgent care clinic since your last visit?  Hospitalized since your last visit?" No    2. "Have you seen or consulted any other health care providers outside of [facility] since your last visit?" No     3. For patients aged 69-75: Has the patient had a colonoscopy / FIT/ Cologuard? Yes      If the patient is  female:    4. For patients aged 69-74: Has the patient had a mammogram within the past 2 years? Yes - Care Gap present. Most recent result on file      5. For patients aged 21-65: Has the patient had a pap smear? NA - based on age or sex

## 2021-07-20 NOTE — Patient Instructions (Signed)
Advised to stop smoking.

## 2021-07-27 ENCOUNTER — Ambulatory Visit: Admit: 2021-07-27 | Discharge: 2021-07-27 | Payer: MEDICARE | Attending: Internal Medicine | Primary: Internal Medicine

## 2021-07-27 ENCOUNTER — Encounter: Attending: Internal Medicine | Primary: Internal Medicine

## 2021-07-27 DIAGNOSIS — E0590 Thyrotoxicosis, unspecified without thyrotoxic crisis or storm: Secondary | ICD-10-CM

## 2021-07-27 NOTE — Progress Notes (Signed)
Chief Complaint   Patient presents with    Thyroid Problem    New Patient     History of Present Illness: Tracy Jordan is a 71 y.o. female seen for discussion related to subclinical hyperthyroidism.    Reports nearly 20 pound weight loss in the past several months.  States that she received I-131 in the 90s in [place].  Was not told that she has Graves' disease.  No recent CT scan with iodine, not taking amiodarone.  No thyroid nodules on CT screening for lung cancer.  Does not recall ever taking methimazole.    Past Medical History:   Diagnosis Date    Arthritis     Chronic back pain greater than 3 months duration     Chronic pain     Decreased thyroid stimulating hormone level     Hepatitis C     Hypercholesterolemia     Hypertension     Osteoporosis     Vitamin D deficiency      Past Surgical History:   Procedure Laterality Date    CATARACT REMOVAL Bilateral     CESAREAN SECTION      CYST REMOVAL      Cyst removed ofrom upper chest area    HYSTERECTOMY (CERVIX STATUS UNKNOWN)       h/o abdominal hysterectomy for uterine fibroids and pelvic pain at age 7    ROTATOR CUFF REPAIR Left      Current Outpatient Medications   Medication Sig    Calcium Carb-Cholecalciferol (CALCIUM 500 + D PO) Take by mouth    ibandronate (BONIVA) 150 MG tablet Take 1 tablet by mouth every 30 days Take one (1) tablet once per month in the morning with a full glass of water, on an empty stomach, and do not take anything else by mouth or lie down for the next 60 minutes.    gabapentin (NEURONTIN) 100 MG capsule Take 1 capsule by mouth as needed.    amLODIPine (NORVASC) 5 MG tablet Take 1 tablet by mouth nightly    lisinopril-hydroCHLOROthiazide (PRINZIDE;ZESTORETIC) 10-12.5 MG per tablet Take 1 tablet by mouth daily     No current facility-administered medications for this visit.     Allergies   Allergen Reactions    Morphine Itching    Oxycodone-Acetaminophen Itching and Nausea And Vomiting     Family History   Problem Relation Age  of Onset    Breast Cancer Maternal Aunt 91    Colon Cancer Sister 63    Hypertension Brother     Hypertension Maternal Aunt     Hypertension Mother     Asthma Mother     Breast Cancer Mother 72    Hypertension Sister        Social Hx: Moved back to [place] to take care of her mother who is 95    Review of Systems:  See HPI    Physical Examination:  BP (!) 140/89   Pulse 61   Resp 16   Ht 5' 1.2" (1.554 m)   Wt 153 lb (69.4 kg)   SpO2 100%   BMI 28.72 kg/m     - GENERAL: NCAT, Appears well nourished   - EYES: EOMI, non-icteric, no proptosis   - Ear/Nose/Throat: NCAT, no visible inflammation or masses   - CARDIOVASCULAR: no cyanosis, no visible JVD   - RESPIRATORY: respiratory effort normal without any distress or labored breathing   - MUSCULOSKELETAL: Normal ROM of neck and upper extremities observed   -  SKIN: No rash on face  - NEUROLOGIC:  No facial asymmetry (Cranial nerve 7 motor function), No gaze palsy   - PSYCHIATRIC: Normal affect, Normal insight and judgement     Data Reviewed:    Latest Reference Range & Units 07/18/21 00:00   T3,FREE,FT3 2.0 - 4.4 pg/mL 3.1   TSH 0.450 - 4.500 uIU/mL 0.224 (L)   T4 Free 0.82 - 1.77 ng/dL 5.57   (L): Data is abnormally low    Assessment/Plan: This is a very pleasant 71 year old female seen for discussion related to probable Graves' disease that was previously treated incompletely with I-131.  Obtain thyroid-stimulating immunoglobulins and I-123 uptake and scan if this is negative.    #Probable Graves' disease status post incomplete treatment with I-131  -Check TSI, if negative obtain I-123 uptake and scan  -Anticipate methimazole use given weight loss and risk of flares with incomplete treatment  -Anticipate referral to thyroid eye disease specialist given dry eyes, no proptosis visible on exam  -Obtain thyroid ultrasound to assess for nodules    Copy sent [id number]  PUTHUMANA, MD    RTC 2-3 mo    Consepcion Hearing, MD  [facility] End[facility]

## 2021-07-28 ENCOUNTER — Inpatient Hospital Stay: Admit: 2021-07-28 | Payer: MEDICARE | Primary: Internal Medicine

## 2021-07-29 LAB — SPECIMEN STATUS REPORT

## 2021-07-29 LAB — THYROID STIMULATING IMMUNOGLOBULIN: TSI: <0.1 IU/L (ref 0.00–0.55)

## 2021-07-30 LAB — THYROID ANTIBODIES
Thyroglobulin Ab: <1 IU/mL (ref 0.0–0.9)
Thyroid Peroxidase (TPO) Abs: 44 IU/mL — ABNORMAL HIGH (ref 0–34)

## 2021-09-05 ENCOUNTER — Telehealth

## 2021-09-05 NOTE — Telephone Encounter (Signed)
[facility] schedule called and state that the pt called to schedule her ultrasound but they were unable to view the order in Epic and asked if the order could be resubmitted.     Order was reordered and the  scheduling dept was made aware.

## 2021-09-11 ENCOUNTER — Inpatient Hospital Stay: Admit: 2021-09-11 | Payer: MEDICARE | Attending: Internal Medicine | Primary: Internal Medicine

## 2021-09-11 ENCOUNTER — Encounter: Payer: MEDICARE | Primary: Internal Medicine

## 2021-09-11 DIAGNOSIS — Z1231 Encounter for screening mammogram for malignant neoplasm of breast: Secondary | ICD-10-CM

## 2021-09-11 DIAGNOSIS — E0590 Thyrotoxicosis, unspecified without thyrotoxic crisis or storm: Secondary | ICD-10-CM

## 2021-09-29 ENCOUNTER — Ambulatory Visit: Payer: MEDICARE | Attending: Internal Medicine | Primary: Internal Medicine

## 2021-10-11 ENCOUNTER — Encounter: Payer: MEDICAID | Attending: Internal Medicine | Primary: Internal Medicine

## 2021-12-13 MED ORDER — AMLODIPINE BESYLATE 5 MG PO TABS
5 MG | ORAL_TABLET | Freq: Every evening | ORAL | 0 refills | Status: DC
Start: 2021-12-13 — End: 2022-01-17

## 2021-12-13 MED ORDER — LISINOPRIL-HYDROCHLOROTHIAZIDE 10-12.5 MG PO TABS
ORAL_TABLET | Freq: Every day | ORAL | 0 refills | Status: DC
Start: 2021-12-13 — End: 2022-01-17

## 2022-01-17 MED ORDER — AMLODIPINE BESYLATE 5 MG PO TABS
5 MG | ORAL_TABLET | Freq: Every evening | ORAL | 0 refills | Status: AC
Start: 2022-01-17 — End: 2022-02-20

## 2022-01-17 MED ORDER — LISINOPRIL-HYDROCHLOROTHIAZIDE 10-12.5 MG PO TABS
ORAL_TABLET | Freq: Every day | ORAL | 0 refills | Status: AC
Start: 2022-01-17 — End: 2022-02-20

## 2022-02-05 ENCOUNTER — Encounter: Payer: MEDICAID | Attending: Internal Medicine | Primary: Internal Medicine

## 2022-02-05 NOTE — Progress Notes (Unsigned)
No chief complaint on file.    History of Present Illness: Tracy Jordan is a 71 y.o. female seen for discussion related to subclinical hyperthyroidism.    Reports nearly 20 pound weight loss in the past several months.  States that she received I-131 in the 90s in [place].  Was not told that she has Graves' disease.  No recent CT scan with iodine, not taking amiodarone.  No thyroid nodules on CT screening for lung cancer.  Does not recall ever taking methimazole.    02/04/2022:      Current Outpatient Medications   Medication Sig    amLODIPine (NORVASC) 5 MG tablet TAKE 1 TABLET BY MOUTH NIGHTLY    lisinopril-hydroCHLOROthiazide (PRINZIDE;ZESTORETIC) 10-12.5 MG per tablet TAKE 1 TABLET BY MOUTH DAILY    Calcium Carb-Cholecalciferol (CALCIUM 500 + D PO) Take by mouth    ibandronate (BONIVA) 150 MG tablet Take 1 tablet by mouth every 30 days Take one (1) tablet once per month in the morning with a full glass of water, on an empty stomach, and do not take anything else by mouth or lie down for the next 60 minutes.    gabapentin (NEURONTIN) 100 MG capsule Take 1 capsule by mouth as needed.     No current facility-administered medications for this visit.     Social Hx: Moved back to [place] to take care of her mother who is 95    Review of Systems:  See HPI    Physical Examination:  There were no vitals taken for this visit.    - GENERAL: NCAT, Appears well nourished   - EYES: EOMI, non-icteric, no proptosis   - Ear/Nose/Throat: NCAT, no visible inflammation or masses   - CARDIOVASCULAR: no cyanosis, no visible JVD   - RESPIRATORY: respiratory effort normal without any distress or labored breathing   - MUSCULOSKELETAL: Normal ROM of neck and upper extremities observed   - SKIN: No rash on face  - NEUROLOGIC:  No facial asymmetry (Cranial nerve 7 motor function), No gaze palsy   - PSYCHIATRIC: Normal affect, Normal insight and judgement     Data Reviewed:    Latest Reference Range & Units 07/18/21 00:00 07/28/21  00:00   Glucose, Random 70 - 99 mg/dL 95    Hemoglobin Z6X 4.8 - 5.6 % 5.9 (H)    eAG (mg/dL) mg/dL 096    Hemoglobin E4V, External % 5.9 (E)    T3,FREE,FT3 2.0 - 4.4 pg/mL 3.1    TSH 0.450 - 4.500 uIU/mL 0.224 (L)    Thyroid Peroxidase (TPO) Abs 0 - 34 IU/mL  44 (H)   T4 Free 0.82 - 1.77 ng/dL 4.09    Thyroglobulin Ab 0.0 - 0.9 IU/mL  <1.0   (H): Data is abnormally high  (L): Data is abnormally low  (E): External lab result      FINDINGS: Real-time sonography of the thyroid gland was performed with a high  frequency linear transducer. Multiple static images were obtained.     Normal thyroid parenchymal echotexture. Tiny colloid cysts bilaterally. No solid  nodules.      The right lobe measures 5.0 x 1.6 x 1.6 cm and the left lobe measures 5.0 x 1.3  x 1.2 cm. The isthmus measures 3 mm.     IMPRESSION:  Tiny colloid cysts bilaterally. No suspicious thyroid nodules.       Assessment/Plan: This is a very pleasant 71 year old female seen for discussion related to probable Graves' disease that was previously treated  incompletely with I-131.  Obtain thyroid-stimulating immunoglobulins and I-123 uptake and scan if this is negative.    #Probable Graves' disease status post incomplete treatment with I-131  -TSI negative, obtain I123 uptake and scan  -Anticipate methimazole use given weight loss and risk of flares with incomplete treatment  -Anticipate referral to thyroid eye disease specialist given dry eyes, no proptosis visible on exam  -thyroid ultrasound with cysts alone    Copy sent [id number], Corliss Blacker, MD    RTC 2-3 mo    Consepcion Hearing, MD  [facility] Endocr[facility]ology

## 2022-02-20 MED ORDER — LISINOPRIL-HYDROCHLOROTHIAZIDE 10-12.5 MG PO TABS
10-12.5 MG | ORAL_TABLET | Freq: Every day | ORAL | 0 refills | Status: DC
Start: 2022-02-20 — End: 2022-08-14

## 2022-02-20 MED ORDER — AMLODIPINE BESYLATE 5 MG PO TABS
5 MG | ORAL_TABLET | Freq: Every evening | ORAL | 0 refills | Status: DC
Start: 2022-02-20 — End: 2022-05-09

## 2022-03-29 ENCOUNTER — Encounter: Payer: MEDICAID | Attending: Internal Medicine | Primary: Internal Medicine

## 2022-03-29 NOTE — Progress Notes (Deleted)
[facility]  [address]  [place], [place]  Phone: [phone]      Tracy Jordan (DOB: 05-08-1950) is a 72 y.o. female, established patient, here for evaluation of the following chief complaint(s):  No chief complaint on file.         SUBJECTIVE/OBJECTIVE:  HPI:  Patient is being seen today for a follow up appointment. He last had blood work done on 03/26/2022. Patient states that she does not check her BP at home. She states that over all she is good and denies any chest pains or shortness of breath. She says that appetite and energy are good. She says that she gets hours of sleep a night. Patient saw endocrinologist Dr. Virgie Dad in June after last visit.      Hemoglobin A1C   Date Value Ref Range Status   07/18/2021 5.9 (H) 4.8 - 5.6 % Final     Comment:              Prediabetes: 5.7 - 6.4           Diabetes: >6.4           Glycemic control for adults with diabetes: <7.0        Hemoglobin   Date Value Ref Range Status   07/18/2021 12.6 11.1 - 15.9 g/dL Final     Hematocrit   Date Value Ref Range Status   07/18/2021 36.1 34.0 - 46.6 % Final     Creatinine   Date Value Ref Range Status   07/18/2021 0.93 0.57 - 1.00 mg/dL Final     Glucose   Date Value Ref Range Status   07/18/2021 95 70 - 99 mg/dL Final     TSH   Date Value Ref Range Status   07/18/2021 0.224 (L) 0.450 - 4.500 uIU/mL Final     Potassium   Date Value Ref Range Status   07/18/2021 4.6 3.5 - 5.2 mmol/L Final     Cholesterol   Date Value Ref Range Status   07/18/2021 200 (H) 100 - 199 mg/dL Final     HDL   Date Value Ref Range Status   07/18/2021 63 >39 mg/dL Final     LDL Calculated   Date Value Ref Range Status   07/18/2021 117 (H) 0 - 99 mg/dL Final     Triglycerides   Date Value Ref Range Status   07/18/2021 116 0 - 149 mg/dL Final     No results found for: "HAV", "HEPAIGM", "HEPBIGM", "HEPBCAB", "HBEAG", "HEPCAB"   MRI Result (most recent):  MRI ABDOMEN W WO CONTRAST  04/13/2021    Narrative  EXAM:  MRI ABD W WO CONT  Clinical history: Liver lesions  INDICATION: Liver lesions    COMPARISON: CT 03/17/2021    TECHNIQUE: Coronal T2 and postcontrast T1; Axial T2, in/out of phase, MRCP, pre-  and dynamic postcontrast T1 MRI of the abdomen. Dotarem. Subtractions were  performed.    FINDINGS:  LUNG BASES:No mass lesion or effusion.  LIVER/GALLBLADDER: There are scattered foci of T2 hyperintensity, these foci  demonstrate a centripetal pattern of postcontrast enhanced imaging on delayed  images. There is hepatic steatosis. There is no intrahepatic duct dilatation.  There is no hepatic parenchymal mass. Hepatic enhancement pattern is within  normal limits. Portal vein is patent. There is gallbladder sludge.  SPLEEN/PANCREAS: No mass.  There is no pancreatic duct dilatation. There is no  evidence of splenomegaly.  ADRENALS/KIDNEYS: Chucky May  left renal cyst.  There is no hydronephrosis. There is no  renal mass. There is no perinephric mass.  STOMACH: No dilatation or wall thickening.  COLON AND SMALL BOWEL: No dilatation or wall thickening. There is no free  intraperitoneal air. There is no evidence of incarceration or obstruction. No  mesenteric adenopathy.  PERITONEUM: Unremarkable.  RETROPERITONEUM: Unremarkable. The abdominal aorta is normal in caliber. No  aneurysm. No retroperitoneal adenopathy.  OSSEOUS STRUCTURES: No destructive bone lesion.    Impression  No acute intraperitoneal process is identified.  Scattered hemangiomas are demonstrated.  There is no suspicious hepatic mass lesion.    Incidental and/or nonemergent findings are as described above.    CT Result (most recent):  CT LUNG SCREENING 06/08/2021    Narrative  This is a summary report. The complete report is available in the patient's medical record. If you cannot access the medical record, please contact the sending organization for a detailed fax or copy.    EXAM:  CT CHEST WITHOUT CONTRAST    INDICATION: Nicotine  dependence.    COMPARISON: None.    CONTRAST: None.    TECHNIQUE: Low dose unenhanced multislice helical CT was performed from the  thoracic inlet to the adrenal glands without intravenous contrast  administration. Contiguous 1.25 mm axial images were reconstructed and lung and  soft tissue windows were generated. Coronal and sagittal reformations and axial  MIP images were generated.  CT dose reduction was achieved through use of a  standardized protocol tailored for this examination and automatic exposure  control for dose modulation. Lack of intravenous contrast limits evaluation of  the vasculature, mediastinum, hila, and solid organs.    DOSE: CTDIvol 2.81 mGy    FINDINGS:  PLEURA: No effusion or pneumothorax.  LUNGS: There is a 1 cm groundglass nodule in the right upper lobe. 2 mm  pulmonary nodule right upper lobe (image 121).    CHEST WALL: No mass or axillary lymphadenopathy.  THYROID: No nodule.  MEDIASTINUM: No mass or lymphadenopathy.  HILA: No mass or lymphadenopathy.  THORACIC AORTA: No aneurysm.  MAIN PULMONARY ARTERY: Normal in caliber.  TRACHEA/BRONCHI: Patent.  ESOPHAGUS: No wall thickening or dilatation.  HEART: Normal in size. Coronary artery calcium: absent    UPPER ABDOMEN: The incidentally imaged upper abdomen is unremarkable.Marland Kitchen  BONES: No evidence of aggressive bone lesion..    Impression  1 cm groundglass nodule right upper lobe. Tiny pulmonary nodule right upper  lobe.    Lung-RADS Category:  2  Management recommendation: Annual screening with low dose CT    [email or web address]      Current Outpatient Medications   Medication Sig    amLODIPine (NORVASC) 5 MG tablet TAKE 1 TABLET BY MOUTH NIGHTLY    lisinopril-hydroCHLOROthiazide (PRINZIDE;ZESTORETIC) 10-12.5 MG per tablet TAKE 1 TABLET BY MOUTH DAILY    Calcium Carb-Cholecalciferol (CALCIUM 500 + D PO) Take by mouth    ibandronate (BONIVA) 150 MG tablet Take 1 tablet by mouth every 30 days Take one (1) tablet once  per month in the morning with a full glass of water, on an empty stomach, and do not take anything else by mouth or lie down for the next 60 minutes.    gabapentin (NEURONTIN) 100 MG capsule Take 1 capsule by mouth as needed.     No current facility-administered medications for this visit.       Allergies   Allergen Reactions    Morphine Itching    Oxycodone-Acetaminophen Itching and Nausea And Vomiting  Past Medical History:   Diagnosis Date    Arthritis     Chronic back pain greater than 3 months duration     Chronic pain     Decreased thyroid stimulating hormone level     Hepatitis C     Hypercholesterolemia     Hypertension     Osteoporosis     Vitamin D deficiency         Family History   Problem Relation Age of Onset    Breast Cancer Maternal Aunt 89    Colon Cancer Sister 57    Hypertension Brother     Hypertension Maternal Aunt     Hypertension Mother     Asthma Mother     Breast Cancer Mother 11    Hypertension Sister         Past Surgical History:   Procedure Laterality Date    CATARACT REMOVAL Bilateral     CESAREAN SECTION      CYST REMOVAL      Cyst removed ofrom upper chest area    HYSTERECTOMY (CERVIX STATUS UNKNOWN)       h/o abdominal hysterectomy for uterine fibroids and pelvic pain at age 79    ROTATOR CUFF REPAIR Left        Social History     Tobacco Use    Smoking status: Every Day     Current packs/day: 0.25     Average packs/day: 0.3 packs/day for 52.1 years (13.0 ttl pk-yrs)     Types: Cigarettes     Start date: 106     Passive exposure: Current    Smokeless tobacco: Never   Vaping Use    Vaping Use: Never used   Substance Use Topics    Alcohol use: Yes     Alcohol/week: 1.0 standard drink of alcohol     Types: 1 Glasses of wine per week     Comment: occ    Drug use: Never         Review of Systems    There were no vitals taken for this visit.     Physical Exam    ASSESSMENT/PLAN:  Below is the assessment and plan developed based on review of pertinent history, physical exam, labs,  studies, and medications.    {There are no diagnoses linked to this encounter. (Refresh or delete this SmartLink)}    No orders of the defined types were placed in this encounter.       No follow-ups on file.     There are no Patient Instructions on file for this visit.     Health Maintenance Due   Topic Date Due    Respiratory Syncytial Virus (RSV) Pregnant or age 56 yrs+ (32 - 52-dose 60+ series) Never done    Flu vaccine (1) Never done    COVID-19 Vaccine (4 - 2023-24 season) 10/20/2021    Depression Screen  02/22/2022    Pneumococcal 65+ years Vaccine (2 - PCV) 03/02/2022    Colorectal Cancer Screen  04/13/2022        Aspects of this note may have been generated using voice recognition software. Despite editing, there may be unrecognized errors.    An electronic signature was used to authenticate this note.  -- Wynne Dust, MA

## 2022-05-09 ENCOUNTER — Ambulatory Visit: Admit: 2022-05-09 | Discharge: 2022-05-09 | Payer: MEDICAID | Attending: Internal Medicine | Primary: Internal Medicine

## 2022-05-09 ENCOUNTER — Encounter

## 2022-05-09 DIAGNOSIS — I10 Essential (primary) hypertension: Secondary | ICD-10-CM

## 2022-05-09 MED ORDER — CYANOCOBALAMIN 1000 MCG/ML IJ SOLN
1000 | INTRAMUSCULAR | 3 refills | Status: DC
Start: 2022-05-09 — End: 2022-06-20

## 2022-05-09 MED ORDER — BACLOFEN 5 MG PO TABS
5 | ORAL_TABLET | Freq: Every evening | ORAL | 0 refills | Status: DC | PRN
Start: 2022-05-09 — End: 2022-09-26

## 2022-05-09 MED ORDER — AMLODIPINE BESYLATE 5 MG PO TABS
5 | ORAL_TABLET | Freq: Two times a day (BID) | ORAL | 3 refills | Status: AC
Start: 2022-05-09 — End: ?

## 2022-05-09 MED ORDER — MELOXICAM 15 MG PO TABS
15 | ORAL_TABLET | Freq: Every day | ORAL | 0 refills | Status: DC
Start: 2022-05-09 — End: 2022-06-20

## 2022-05-09 MED ORDER — IBANDRONATE SODIUM 150 MG PO TABS
150 | ORAL_TABLET | ORAL | 3 refills | Status: DC
Start: 2022-05-09 — End: 2022-06-20

## 2022-05-09 NOTE — Assessment & Plan Note (Signed)
Wrote to repeat CT chest

## 2022-05-09 NOTE — Assessment & Plan Note (Signed)
B12 level 126.  Will place her on B12 injection.  Also advised to take sublingual vitamin B12 every day.

## 2022-05-09 NOTE — Progress Notes (Signed)
[facility]  [address]  [place], [place]  Phone: [phone]      Tracy Jordan (DOB: Dec 10, 1950) is a 72 y.o. female, established patient, here for evaluation of the following chief complaint(s):  Weight Loss and Follow-up Chronic Condition         SUBJECTIVE/OBJECTIVE:  HPI:  This is a 22 old female with past medical history of hypertension,Osteoporosis, chronic hepatitis C, right upper lobe pulmonary nodule hypercholesterolemia here for 9 months follow-up.  Patient states she is not losing weight anymore, has been gaining. She had lab work done in February. Over all she is doing good and denies any chest pains or shortness of breath. She has seen endocrinologist Dr. Raynelle Bring subclinical hyperthyroidism since last in office.  Had a thyroid ultrasound 7/23 showed tiny colloidal cyst bilaterally no suspicious nodules.  She had her mammogram in July 2023. She states that she is having lower back pain every day. Patient says that she has numbness in legs and feet.  She states that she has a pinched nerve. She needs a 90 day refill on amlodipine.  Patient states back has been hurting for a while.  Lab review on 03/26/2022 white count was 5.4.  Hemoglobin 12.2.  Platelet count 268.  Blood sugar 74.  Potassium 4.4.  LFTs within normal limit.  Hepatitis C not 126.  Folic acid 12.8.Patient states that she has numbness in her feet and toes.  Patient wants FMLA form done stating that she cannot stand for 8 hours.  She can stand only for 6 hours with intermittent breaks    Hemoglobin A1C   Date Value Ref Range Status   07/18/2021 5.9 (H) 4.8 - 5.6 % Final     Comment:              Prediabetes: 5.7 - 6.4           Diabetes: >6.4           Glycemic control for adults with diabetes: <7.0        Hemoglobin   Date Value Ref Range Status   07/18/2021 12.6 11.1 - 15.9 g/dL Final     Hematocrit   Date Value Ref Range Status   07/18/2021 36.1 34.0 - 46.6 % Final     Creatinine    Date Value Ref Range Status   07/18/2021 0.93 0.57 - 1.00 mg/dL Final     Glucose   Date Value Ref Range Status   07/18/2021 95 70 - 99 mg/dL Final     TSH   Date Value Ref Range Status   07/18/2021 0.224 (L) 0.450 - 4.500 uIU/mL Final     Potassium   Date Value Ref Range Status   07/18/2021 4.6 3.5 - 5.2 mmol/L Final     Cholesterol   Date Value Ref Range Status   07/18/2021 200 (H) 100 - 199 mg/dL Final     HDL   Date Value Ref Range Status   07/18/2021 63 >39 mg/dL Final     LDL Calculated   Date Value Ref Range Status   07/18/2021 117 (H) 0 - 99 mg/dL Final     Triglycerides   Date Value Ref Range Status   07/18/2021 116 0 - 149 mg/dL Final       MRI Result (most recent):  MRI ABDOMEN W WO CONTRAST 04/13/2021    Narrative  EXAM:  MRI ABD W WO CONT  Clinical history: Liver lesions  INDICATION: Liver lesions    COMPARISON: CT 03/17/2021    TECHNIQUE: Coronal T2 and postcontrast T1; Axial T2, in/out of phase, MRCP, pre-  and dynamic postcontrast T1 MRI of the abdomen. Dotarem. Subtractions were  performed.    FINDINGS:  LUNG BASES:No mass lesion or effusion.  LIVER/GALLBLADDER: There are scattered foci of T2 hyperintensity, these foci  demonstrate a centripetal pattern of postcontrast enhanced imaging on delayed  images. There is hepatic steatosis. There is no intrahepatic duct dilatation.  There is no hepatic parenchymal mass. Hepatic enhancement pattern is within  normal limits. Portal vein is patent. There is gallbladder sludge.  SPLEEN/PANCREAS: No mass.  There is no pancreatic duct dilatation. There is no  evidence of splenomegaly.  ADRENALS/KIDNEYS: Tiny left renal cyst.  There is no hydronephrosis. There is no  renal mass. There is no perinephric mass.  STOMACH: No dilatation or wall thickening.  COLON AND SMALL BOWEL: No dilatation or wall thickening. There is no free  intraperitoneal air. There is no evidence of incarceration or obstruction. No  mesenteric adenopathy.  PERITONEUM:  Unremarkable.  RETROPERITONEUM: Unremarkable. The abdominal aorta is normal in caliber. No  aneurysm. No retroperitoneal adenopathy.  OSSEOUS STRUCTURES: No destructive bone lesion.    Impression  No acute intraperitoneal process is identified.  Scattered hemangiomas are demonstrated.  There is no suspicious hepatic mass lesion.    Incidental and/or nonemergent findings are as described above.    CT Result (most recent):  CT LUNG SCREENING 06/08/2021    Narrative  This is a summary report. The complete report is available in the patient's medical record. If you cannot access the medical record, please contact the sending organization for a detailed fax or copy.    EXAM:  CT CHEST WITHOUT CONTRAST    INDICATION: Nicotine dependence.    COMPARISON: None.    CONTRAST: None.    TECHNIQUE: Low dose unenhanced multislice helical CT was performed from the  thoracic inlet to the adrenal glands without intravenous contrast  administration. Contiguous 1.25 mm axial images were reconstructed and lung and  soft tissue windows were generated. Coronal and sagittal reformations and axial  MIP images were generated.  CT dose reduction was achieved through use of a  standardized protocol tailored for this examination and automatic exposure  control for dose modulation. Lack of intravenous contrast limits evaluation of  the vasculature, mediastinum, hila, and solid organs.    DOSE: CTDIvol 2.81 mGy    FINDINGS:  PLEURA: No effusion or pneumothorax.  LUNGS: There is a 1 cm groundglass nodule in the right upper lobe. 2 mm  pulmonary nodule right upper lobe (image 121).    CHEST WALL: No mass or axillary lymphadenopathy.  THYROID: No nodule.  MEDIASTINUM: No mass or lymphadenopathy.  HILA: No mass or lymphadenopathy.  THORACIC AORTA: No aneurysm.  MAIN PULMONARY ARTERY: Normal in caliber.  TRACHEA/BRONCHI: Patent.  ESOPHAGUS: No wall thickening or dilatation.  HEART: Normal in size. Coronary artery calcium: absent    UPPER ABDOMEN: The  incidentally imaged upper abdomen is unremarkable.Marland Kitchen  BONES: No evidence of aggressive bone lesion..    Impression  1 cm groundglass nodule right upper lobe. Tiny pulmonary nodule right upper  lobe.    Lung-RADS Category:  2  Management recommendation: Annual screening with low dose CT    [email or web address]      Current Outpatient Medications   Medication Sig    cyanocobalamin 1000 MCG/ML injection Inject 1 mL into the muscle every 30 days  meloxicam (MOBIC) 15 MG tablet Take 1 tablet by mouth daily    baclofen (LIORESAL) 5 MG tablet Take 1 tablet by mouth nightly as needed (muscle spasms)    ibandronate (BONIVA) 150 MG tablet Take 1 tablet by mouth every 30 days Take one (1) tablet once per month in the morning with a full glass of water, on an empty stomach, and do not take anything else by mouth or lie down for the next 60 minutes.    amLODIPine (NORVASC) 5 MG tablet Take 1 tablet by mouth in the morning and at bedtime    lisinopril-hydroCHLOROthiazide (PRINZIDE;ZESTORETIC) 10-12.5 MG per tablet TAKE 1 TABLET BY MOUTH DAILY     No current facility-administered medications for this visit.       Allergies   Allergen Reactions    Morphine Itching    Oxycodone-Acetaminophen Itching and Nausea And Vomiting        Past Medical History:   Diagnosis Date    Arthritis     Chronic back pain greater than 3 months duration     Chronic pain     Decreased thyroid stimulating hormone level     Hepatitis C     Hypercholesterolemia     Hypertension     Osteoporosis     Vitamin D deficiency         Family History   Problem Relation Age of Onset    Breast Cancer Maternal Aunt 89    Colon Cancer Sister 57    Hypertension Brother     Hypertension Maternal Aunt     Hypertension Mother     Asthma Mother     Breast Cancer Mother 52    Hypertension Sister         Past Surgical History:   Procedure Laterality Date    CATARACT REMOVAL Bilateral     CESAREAN SECTION      CYST REMOVAL      Cyst removed ofrom upper  chest area    HYSTERECTOMY (CERVIX STATUS UNKNOWN)       h/o abdominal hysterectomy for uterine fibroids and pelvic pain at age 52    ROTATOR CUFF REPAIR Left        Social History     Tobacco Use    Smoking status: Every Day     Current packs/day: 0.25     Average packs/day: 0.3 packs/day for 52.2 years (13.1 ttl pk-yrs)     Types: Cigarettes     Start date: 66     Passive exposure: Current    Smokeless tobacco: Never   Vaping Use    Vaping Use: Never used   Substance Use Topics    Alcohol use: Yes     Alcohol/week: 1.0 standard drink of alcohol     Types: 1 Glasses of wine per week     Comment: occ    Drug use: Never         Review of Systems   Constitutional:  Negative for appetite change and fatigue.   Eyes:  Negative for visual disturbance.   Respiratory:  Negative for cough and shortness of breath.    Cardiovascular:  Negative for chest pain and leg swelling.   Gastrointestinal:  Negative for abdominal pain, diarrhea, nausea and vomiting.   Genitourinary:  Negative for dysuria.   Musculoskeletal:  Positive for back pain.   Skin:  Negative for rash.   Neurological:  Negative for tremors and headaches.   Psychiatric/Behavioral:  The patient is not nervous/anxious.  BP (!) 142/94   Pulse 57   Temp 98.2 F (36.8 C)   Resp 16   Ht 1.554 m (5' 1.2")   Wt 73.5 kg (162 lb)   SpO2 98%   BMI 30.41 kg/m      Physical Exam  Gen:  Not  in no acute distress. Well built and nourished.  HEENT:  Pink conjunctivae, PERLA, EOMI, hearing intact .  Mouth: Moist mucous membranes. No TPC  Neck:  Supple, without masses, thyroid not enlarged  Resp:  No accessory muscle use, clear breath sounds without wheezes rales or rhonchi  Card:  No murmurs, normal S1, S2 without thrills, bruits or peripheral edema  Abd:  Soft, non-tender, non-distended, normoactive bowel sounds are present, no palpable organomegaly and no detectable hernias  Lymph:  No cervical or inguinal adenopathy  Musc:  No cyanosis or clubbing.  Positive  mild tenderness lumbar spine.  SLR negative.  Gait: Normal  Skin:  No rashes or ulcers, skin turgor is good  Neuro: Alert and oriented x 3. Cranial nerves are grossly intact, no focal motor weakness, follows commands appropriately  Psych:  Good insight, mood normal.   ASSESSMENT/PLAN:  Below is the assessment and plan developed based on review of pertinent history, physical exam, labs, studies, and medications.    1. Essential hypertension, benign  Comments:  Blood pressure elevated today.  Advised to increase amlodipine to 5 mg twice a day.  Orders:  -     Urinalysis with Microscopic; Future  2. Chronic bilateral low back pain with bilateral sciatica  Comments:  Patient states she cannot take gabapentin.  FMLA form filled.  Orders:  -     meloxicam (MOBIC) 15 MG tablet; Take 1 tablet by mouth daily, Disp-30 tablet, R-0Normal  -     baclofen (LIORESAL) 5 MG tablet; Take 1 tablet by mouth nightly as needed (muscle spasms), Disp-30 tablet, R-0Normal  -     Amb External Referral To Orthopedic Surgery  3. Post-menopausal osteoporosis  Comments:  To restart Boniva.  Orders:  -     ibandronate (BONIVA) 150 MG tablet; Take 1 tablet by mouth every 30 days Take one (1) tablet once per month in the morning with a full glass of water, on an empty stomach, and do not take anything else by mouth or lie down for the next 60 minutes., Disp-3 tablet, R-3Normal  4. Right upper lobe pulmonary nodule  Comments:  Needs CT chest next month.  Assessment & Plan:  Wrote to repeat CT chest  5. Cigarette nicotine dependence without complication  Comments:  Advised to stop smoking.  6. Prediabetes  Comments:  Need to recheck.  7. Hypercholesterolemia  Comments:  Need to recheck  Orders:  -     Lipid Panel; Future  8. Subclinical hyperthyroidism  Comments:  Patient has been evaluated by endocrinologist.  Assessment & Plan:  Has been to endocrinologist Dr Raynelle Bring  Orders:  -     TSH; Future  -     T3; Future  -     T4; Future  9. B12  deficiency  Comments:  Vitamin B12 level is very low at 126.  Advised B12 injection and also can take sublingual vitamin B12.  Assessment & Plan:  B12 level 126.  Will place her on B12 injection.  Also advised to take sublingual vitamin B12 every day.  Orders:  -     cyanocobalamin 1000 MCG/ML injection; Inject 1 mL into the muscle every 30  days, Disp-3 mL, R-3Normal  10. Chronic hepatitis C without hepatic coma ([place])  Comments:  Repeat blood work negative  Assessment & Plan:   Monitored by specialist- no acute findings meriting change in the plan  11. Vitamin D deficiency  Comments:  To recheck.  Orders:  -     Vitamin D 25 Hydroxy; Future  12. Abnormal blood sugar  -     Hemoglobin A1C; Future      Orders Placed This Encounter    Lipid Panel     Standing Status:   Future     Standing Expiration Date:   05/09/2023    TSH     Standing Status:   Future     Standing Expiration Date:   05/09/2023    Urinalysis with Microscopic     Standing Status:   Future     Standing Expiration Date:   05/09/2023     Order Specific Question:   SPECIFY(EX-CATH,MIDSTREAM,CYSTO,ETC)?     Answer:   midstream    Hemoglobin A1C     Standing Status:   Future     Standing Expiration Date:   05/09/2023    Vitamin D 25 Hydroxy     Standing Status:   Future     Standing Expiration Date:   05/09/2023    T3     Standing Status:   Future     Standing Expiration Date:   05/09/2023    T4     Standing Status:   Future     Standing Expiration Date:   05/09/2023    Amb External Referral To Orthopedic Surgery     Referral Priority:   Routine     Referral Type:   Consult for Advice and Opinion     Referred to Provider:   Jama Flavors, MD     Requested Specialty:   Orthopedic Surgery     Number of Visits Requested:   1    cyanocobalamin 1000 MCG/ML injection     Sig: Inject 1 mL into the muscle every 30 days     Dispense:  3 mL     Refill:  3    meloxicam (MOBIC) 15 MG tablet     Sig: Take 1 tablet by mouth daily     Dispense:  30 tablet     Refill:  0     baclofen (LIORESAL) 5 MG tablet     Sig: Take 1 tablet by mouth nightly as needed (muscle spasms)     Dispense:  30 tablet     Refill:  0    ibandronate (BONIVA) 150 MG tablet     Sig: Take 1 tablet by mouth every 30 days Take one (1) tablet once per month in the morning with a full glass of water, on an empty stomach, and do not take anything else by mouth or lie down for the next 60 minutes.     Dispense:  3 tablet     Refill:  3    amLODIPine (NORVASC) 5 MG tablet     Sig: Take 1 tablet by mouth in the morning and at bedtime     Dispense:  60 tablet     Refill:  3        Return in about 4 weeks (around 06/06/2022) for follow up.     Patient Instructions   Advised back stretching exercise.      Health Maintenance Due   Topic Date Due    Pneumococcal 65+  years Vaccine (2 of 2 - PCV) 03/02/2022    Colorectal Cancer Screen  04/13/2022        Aspects of this note may have been generated using voice recognition software. Despite editing, there may be unrecognized errors.    An electronic signature was used to authenticate this note.  -- Jerl Santos, MD

## 2022-05-09 NOTE — Assessment & Plan Note (Signed)
Has been to endocrinologist Dr Raynelle Bring

## 2022-05-09 NOTE — Patient Instructions (Signed)
Advised back stretching exercise.

## 2022-05-09 NOTE — Assessment & Plan Note (Signed)
Monitored by specialist- no acute findings meriting change in the plan

## 2022-05-10 ENCOUNTER — Inpatient Hospital Stay: Admit: 2022-05-10 | Payer: MEDICARE | Primary: Internal Medicine

## 2022-05-11 LAB — URINALYSIS WITH MICROSCOPIC
Blood, Urine: NEGATIVE
Glucose, Ur: NEGATIVE
Nitrite, Urine: NEGATIVE
Specific Gravity, UA: 1.026 (ref 1.005–1.030)
Urobilinogen, Urine: 1 mg/dL (ref 0.2–1.0)
pH, UA: 8 — ABNORMAL HIGH (ref 5.0–7.5)

## 2022-05-11 LAB — MICROSCOPIC EXAMINATION
Casts UA: NONE SEEN /lpf
Epithelial Cells, UA: >10 /hpf — AB (ref 0–10)
RBC, UA: NONE SEEN /hpf (ref 0–2)

## 2022-05-11 LAB — HEMOGLOBIN A1C: Hemoglobin A1C: 6.2 % — ABNORMAL HIGH (ref 4.8–5.6)

## 2022-05-11 LAB — VITAMIN D 25 HYDROXY: Vit D, 25-Hydroxy: 22.7 ng/mL — ABNORMAL LOW (ref 30.0–100.0)

## 2022-05-11 LAB — SPECIMEN STATUS REPORT

## 2022-05-12 LAB — LIPID PANEL
Cholesterol: 217 mg/dL — ABNORMAL HIGH (ref 100–199)
HDL: 68 mg/dL (ref >39)
LDL Calculated: 134 mg/dL — ABNORMAL HIGH (ref 0–99)
Triglycerides: 87 mg/dL (ref 0–149)
VLDL Cholesterol Calculated: 15 mg/dL (ref 5–40)

## 2022-05-12 LAB — T3: T3, Total: 133 ng/dL (ref 71–180)

## 2022-05-12 LAB — TSH: TSH: 0.524 u[IU]/mL (ref 0.450–4.500)

## 2022-05-12 LAB — T4: T4, Total: 9 ug/dL (ref 4.5–12.0)

## 2022-05-20 MED ORDER — VITAMIN D (ERGOCALCIFEROL) 1.25 MG (50000 UT) PO CAPS
1.25 | ORAL_CAPSULE | ORAL | 1 refills | Status: DC
Start: 2022-05-20 — End: 2022-09-26

## 2022-06-20 ENCOUNTER — Ambulatory Visit: Admit: 2022-06-20 | Discharge: 2022-06-20 | Payer: MEDICARE | Attending: Internal Medicine | Primary: Internal Medicine

## 2022-06-20 DIAGNOSIS — I10 Essential (primary) hypertension: Secondary | ICD-10-CM

## 2022-06-20 MED ORDER — CYANOCOBALAMIN 1000 MCG/ML IJ SOLN
1000 | INTRAMUSCULAR | 3 refills | Status: DC
Start: 2022-06-20 — End: 2022-09-26

## 2022-06-20 NOTE — Patient Instructions (Signed)
Learning About Lung Cancer Screening  What is screening for lung cancer?     Lung cancer screening is a way to find some lung cancers early, before a person has any symptoms of the cancer.  Lung cancer screening may help those who have the highest risk for lung cancer--people age 70 and older who are or were heavy smokers. For most people, who aren't at increased risk, screening for lung cancer probably isn't helpful.  Screening won't prevent cancer. And it may not find all lung cancers. Lung cancer screening may lower the risk of dying from lung cancer in a small number of people.  How is it done?  Lung cancer screening is done with a low-dose CT (computed tomography) scan. A CT scan uses X-rays, or radiation, to make detailed pictures of your body. Experts recommend that screening be done in medical centers that focus on finding and treating lung cancer.  Who is screening recommended for?  Lung cancer screening is recommended for people age 70 and older who are or were heavy smokers. That means people with a smoking history of at least 20 pack years. A pack year is a way to measure how heavy a smoker you are or were.  To figure out your pack years, multiply how many packs a day on average (assuming 20 cigarettes per pack) you have smoked by how many years you have smoked. For example:  If you smoked 1 pack a day for 20 years, that's 1 times 20. So you have a smoking history of 20 pack years.  If you smoked 2 packs a day for 10 years, that's 2 times 10. So you have a smoking history of 20 pack years.  Experts agree that screening is for people who have a high risk of lung cancer. But experts don't agree on what high risk means. Some say people age 70 or older with at least a 20-pack-year smoking history are high risk. Others say it's people age 55 or older with a 30-pack-year history.  To see if you could benefit from screening, first find out if you are at high risk for lung cancer. Your doctor can help you  decide your lung cancer risk.  What are the risks of screening?  CT screening for lung cancer isn't perfect. It can show an abnormal result when it turns out there wasn't any cancer. This is called a false-positive result. This means you may need more tests to make sure you don't have cancer. These tests can be harmful and cause a lot of worry.  These tests may include more CT scans and invasive testing like a lung biopsy. In a biopsy, the doctor takes a sample of tissue from inside your lung so it can be looked at under a microscope. A biopsy is the only way to tell if you have lung cancer. If the biopsy finds cancer, you and your doctor will have to decide how or whether to treat it.  Some lung cancers found on CT scans are harmless and would not have caused a problem if they had not been found through screening. But because doctors can't tell which ones will turn out to be harmless, most will be treated. This means that you may get treatment--including surgery, radiation, or chemotherapy--that you don't need.  There is a risk of damage to cells or tissue from being exposed to radiation, including the small amounts used in CTs, X-rays, and other medical tests. Over time, exposure to radiation may cause cancer   and other health problems. But in most cases, the risk of getting cancer from being exposed to small amounts of radiation is low. It's not a reason to avoid these tests for most people.  What are the benefits of screening?  Your scan may be normal (negative).  For some people who are at higher risk, screening lowers the chance of dying of lung cancer. How much and how long you smoked helps to determine your risk level. Screening can find some cancers early, when treatment may be more likely to work.  What happens after screening?  The results of your CT scan will be sent to your doctor. Someone from your care team will explain the results of your scan and answer any questions you may have. If you need any  follow-up, he or she will help you understand what to do next.  After a lung cancer screening, you can go back to your usual activities right away.  A lung cancer screening test can't tell if you have lung cancer. If your results are positive, your doctor can't tell whether an abnormal finding is a harmless nodule, cancer, or something else without doing more tests.  What can you do to help prevent lung cancer?  Some lung cancers can't be prevented. But if you smoke, quitting smoking is the best step you can take to prevent lung cancer. If you want to quit, your doctor can recommend medicines or other ways to help.  Follow-up care is a key part of your treatment and safety. Be sure to make and go to all appointments, and call your doctor if you are having problems. It's also a good idea to know your test results and keep a list of the medicines you take.  Where can you learn more?  Go to https://www.healthwise.net/patientEd and enter Q940 to learn more about "Learning About Lung Cancer Screening."  Current as of: October 25, 2023Content Version: 14.0   2006-2024 [employer].   Care instructions adapted under license by [facility]. If you have questions about a medical condition or this instruction, always ask your healthcare professional. [employer] disclaims any warranty or liability for your use of this information.

## 2022-06-20 NOTE — Progress Notes (Signed)
[facility]  [address]  [place], [place] [address]  Phone: [phone]      Tracy Jordan (DOB: [birth year]) is a 72 y.o. female, established patient, here for evaluation of the following chief complaint(s):  Follow-up Chronic Condition (1 month)         SUBJECTIVE/OBJECTIVE:  History of Present Illness  The patient is a 72 year old female with a past medical history of hypertension, chronic hepatitis C, osteoporosis, hypercholesterolemia, and  right upper lobe pulmonary nodule, presenting for a 18-month follow-up.    Since her last visit, the patient has not undergone any diagnostic testing or consultations with any specialists. She continues to smoke, having consumed one pack of cigarettes for three days since last week.    During her previous visit, the patient's amlodipine dosage was escalated to 5 mg twice daily due to elevated blood pressure readings. Her blood pressure has shown improvement during this visit.    Patient has not yet commenced Boniva treatment for her osteoporosis and has expressed a desire to, however, she has chosen not to take it.    In 05/2022, the patient received a B12 injection and is due for another.     Patient consulted with an orthopedist for her back pain. 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 She was prescribed gabapentin 300 mg, which she reports is too strong for her. Her back pain has since improved.    Supplemental Information  She was treated for hepatitis C and was told it was no longer present.     Patient had blood work done on 05/10/2022 total cholesterol was 217.  HDL 68.  LDL 134.  Hemoglobin A1c 6.2.  Vitamin D low at 22.7.  Analysis was cloudy with many bacteria.  Patient had a CBC and CMP done by gastroenterologist on 03/26/2022 which was within normal limit.  Hepatitis C not detected vitamin B12 was low at 126     Hemoglobin A1C   Date Value Ref Range Status   05/10/2022 6.2 (H) 4.8 - 5.6 % Final      Comment:                 Prediabetes: 5.7 - 6.4           Diabetes: >6.4           Glycemic control for adults with diabetes: <7.0        Hemoglobin   Date Value Ref Range Status   07/18/2021 12.6 11.1 - 15.9 g/dL Final     Hematocrit   Date Value Ref Range Status   07/18/2021 36.1 34.0 - 46.6 % Final     Creatinine   Date Value Ref Range Status   07/18/2021 0.93 0.57 - 1.00 mg/dL Final     Glucose   Date Value Ref Range Status   07/18/2021 95 70 - 99 mg/dL Final     TSH   Date Value Ref Range Status   05/10/2022 0.524 0.450 - 4.500 uIU/mL Final     Potassium   Date Value Ref Range Status   07/18/2021 4.6 3.5 - 5.2 mmol/L Final     Cholesterol   Date Value Ref Range Status   05/10/2022 217 (H) 100 - 199 mg/dL Final     HDL   Date Value Ref Range Status   05/10/2022 68 >39 mg/dL Final     Triglycerides   Date Value Ref Range Status   05/10/2022 87 0 -  149 mg/dL Final        MRI Result (most recent):  MRI ABDOMEN W WO CONTRAST 04/13/2021    Narrative  EXAM:  MRI ABD W WO CONT  Clinical history: Liver lesions  INDICATION: Liver lesions    COMPARISON: CT 03/17/2021    TECHNIQUE: Coronal T2 and postcontrast T1; Axial T2, in/out of phase, MRCP, pre-  and dynamic postcontrast T1 MRI of the abdomen. Dotarem. Subtractions were  performed.    FINDINGS:  LUNG BASES:No mass lesion or effusion.  LIVER/GALLBLADDER: There are scattered foci of T2 hyperintensity, these foci  demonstrate a centripetal pattern of postcontrast enhanced imaging on delayed  images. There is hepatic steatosis. There is no intrahepatic duct dilatation.  There is no hepatic parenchymal mass. Hepatic enhancement pattern is within  normal limits. Portal vein is patent. There is gallbladder sludge.  SPLEEN/PANCREAS: No mass.  There is no pancreatic duct dilatation. There is no  evidence of splenomegaly.  ADRENALS/KIDNEYS: Tiny left renal cyst.  There is no hydronephrosis. There is no  renal mass. There is no perinephric mass.  STOMACH: No dilatation or wall  thickening.  COLON AND SMALL BOWEL: No dilatation or wall thickening. There is no free  intraperitoneal air. There is no evidence of incarceration or obstruction. No  mesenteric adenopathy.  PERITONEUM: Unremarkable.  RETROPERITONEUM: Unremarkable. The abdominal aorta is normal in caliber. No  aneurysm. No retroperitoneal adenopathy.  OSSEOUS STRUCTURES: No destructive bone lesion.    Impression  No acute intraperitoneal process is identified.  Scattered hemangiomas are demonstrated.  There is no suspicious hepatic mass lesion.    Incidental and/or nonemergent findings are as described above.    CT Result (most recent):  CT LUNG SCREENING 06/08/2021    Narrative  This is a summary report. The complete report is available in the patient's medical record. If you cannot access the medical record, please contact the sending organization for a detailed fax or copy.    EXAM:  CT CHEST WITHOUT CONTRAST    INDICATION: Nicotine dependence.    COMPARISON: None.    CONTRAST: None.    TECHNIQUE: Low dose unenhanced multislice helical CT was performed from the  thoracic inlet to the adrenal glands without intravenous contrast  administration. Contiguous 1.25 mm axial images were reconstructed and lung and  soft tissue windows were generated. Coronal and sagittal reformations and axial  MIP images were generated.  CT dose reduction was achieved through use of a  standardized protocol tailored for this examination and automatic exposure  control for dose modulation. Lack of intravenous contrast limits evaluation of  the vasculature, mediastinum, hila, and solid organs.    DOSE: CTDIvol 2.81 mGy    FINDINGS:  PLEURA: No effusion or pneumothorax.  LUNGS: There is a 1 cm groundglass nodule in the right upper lobe. 2 mm  pulmonary nodule right upper lobe (image 121).    CHEST WALL: No mass or axillary lymphadenopathy.  THYROID: No nodule.  MEDIASTINUM: No mass or lymphadenopathy.  HILA: No mass or lymphadenopathy.  THORACIC AORTA: No  aneurysm.  MAIN PULMONARY ARTERY: Normal in caliber.  TRACHEA/BRONCHI: Patent.  ESOPHAGUS: No wall thickening or dilatation.  HEART: Normal in size. Coronary artery calcium: absent    UPPER ABDOMEN: The incidentally imaged upper abdomen is unremarkable.Marland Kitchen  BONES: No evidence of aggressive bone lesion..    Impression  1 cm groundglass nodule right upper lobe. Tiny pulmonary nodule right upper  lobe.    Lung-RADS Category:  2  Management recommendation:  Annual screening with low dose CT    [email or web address]      Current Outpatient Medications   Medication Sig    gabapentin (NEURONTIN) 300 MG capsule TAKE ONE capsule BY MOUTH DAILY FOR 7 DAYS, THEN ONE capule TWICE DAILY FOR 7 DAYS, THEN ONE capsule THREE TIMES DAILY THEREAFTER    cyanocobalamin 1000 MCG/ML injection Inject 1 mL into the muscle every 30 days    vitamin D (ERGOCALCIFEROL) 1.25 MG (50000 UT) CAPS capsule Take 1 capsule by mouth once a week    baclofen (LIORESAL) 5 MG tablet Take 1 tablet by mouth nightly as needed (muscle spasms)    amLODIPine (NORVASC) 5 MG tablet Take 1 tablet by mouth in the morning and at bedtime    lisinopril-hydroCHLOROthiazide (PRINZIDE;ZESTORETIC) 10-12.5 MG per tablet TAKE 1 TABLET BY MOUTH DAILY     No current facility-administered medications for this visit.       Allergies   Allergen Reactions    Morphine Itching    Oxycodone-Acetaminophen Itching and Nausea And Vomiting        Past Medical History:   Diagnosis Date    Arthritis     Chronic back pain greater than 3 months duration     Chronic pain     Decreased thyroid stimulating hormone level     Hepatitis C     Hypercholesterolemia     Hypertension     Osteoporosis     Vitamin D deficiency         Family History   Problem Relation Age of Onset    Breast Cancer Maternal Aunt 89    Colon Cancer Sister 30    Hypertension Brother     Hypertension Maternal Aunt     Hypertension Mother     Asthma Mother     Breast Cancer Mother 56    Hypertension  Sister         Past Surgical History:   Procedure Laterality Date    CATARACT REMOVAL Bilateral     CESAREAN SECTION      CYST REMOVAL      Cyst removed ofrom upper chest area    HYSTERECTOMY (CERVIX STATUS UNKNOWN)       h/o abdominal hysterectomy for uterine fibroids and pelvic pain at age 71    ROTATOR CUFF REPAIR Left        Social History     Tobacco Use    Smoking status: Every Day     Current packs/day: 0.25     Average packs/day: 0.3 packs/day for 52.3 years (13.1 ttl pk-yrs)     Types: Cigarettes     Start date: 75     Passive exposure: Current    Smokeless tobacco: Never   Vaping Use    Vaping Use: Never used   Substance Use Topics    Alcohol use: Yes     Alcohol/week: 1.0 standard drink of alcohol     Types: 1 Glasses of wine per week     Comment: occ    Drug use: Never         Review of Systems   Constitutional:  Negative for appetite change and fatigue.   Eyes:  Negative for visual disturbance.   Respiratory:  Negative for cough and shortness of breath.    Cardiovascular:  Negative for chest pain and leg swelling.   Gastrointestinal:  Negative for abdominal pain, diarrhea, nausea and vomiting.   Genitourinary:  Negative for dysuria.   Musculoskeletal:  Positive for  back pain.   Skin:  Negative for rash.   Neurological:  Negative for tremors and headaches.   Psychiatric/Behavioral:  The patient is not nervous/anxious.        BP 116/81 (Site: Right Upper Arm, Position: Sitting, Cuff Size: Medium Adult)   Pulse 67   Resp 16   Ht 1.554 m (5' 1.2")   Wt 71.7 kg (158 lb)   SpO2 99%   BMI 29.66 kg/m      Physical Exam  Vitals and nursing note reviewed.     Gen:  Not  in no acute distress. Well built and nourished.  HEENT:  Pink conjunctivae, PERLA, EOMI, hearing intact Mouth: Moist mucous membranes. No TPC  Neck:  Supple, without masses, thyroid not enlarged  Resp:  No accessory muscle use, clear breath sounds without wheezes rales or rhonchi  Card:  No murmurs, normal S1, S2 without thrills, bruits  or peripheral edema  Abd:  Soft, non-tender, non-distended, normoactive bowel sounds are present, no palpable organomegaly and no detectable hernias  Lymph:  No cervical or inguinal adenopathy  Musc:  No cyanosis or clubbing. -ve tenderness lumbar spine.  SLR negative.  Gait: Normal  Skin:  No rashes or ulcers, skin turgor is good  Neuro: Alert and oriented x 3. Cranial nerves are grossly intact, no focal motor weakness, follows commands appropriately  Psych:  Good insight, mood normal.   ASSESSMENT/PLAN:  Below is the assessment and plan developed based on review of pertinent history, physical exam, labs, studies, and medications.    Assessment & Plan       1. Essential hypertension, benign  Comments:  Blood pressure improved.  Continue amlodipine 5 mg twice a day.  Orders:  -     Comprehensive Metabolic Panel; Future  -     Urinalysis with Reflex to Culture; Future  2. B12 deficiency  Comments:  Vitamin B12 level is very low at 126.  Advised B12 injection and also can take sublingual vitamin B12.  Orders:  -     cyanocobalamin 1000 MCG/ML injection; Inject 1 mL into the muscle every 30 days, Disp-3 mL, R-3Normal  -     CBC with Auto Differential; Future  3. Post-menopausal osteoporosis  Comments:  Patient does not want to take Boniva.  Advised to take calcium, vitamin D and weightbearing exercise.  4. Prediabetes  Comments:  A1c A1c 6.2.  Advised to avoid sweets, too much starches and keep exercise on board.  Orders:  -     Hemoglobin A1C; Future  5. Vitamin D deficiency  Comments:  Patient on vitamin D supplement.  6. Right upper lobe pulmonary nodule  Comments:  Will repeat CT.  7. Hypercholesterolemia  Comments:  Advised  low-cholesterol diet.  Orders:  -     Lipid Panel; Future  8. Bilateral low back pain, unspecified chronicity, unspecified whether sciatica present  Assessment & Plan:  Gabapentin 300 mg is too strong for her, advised to call orthopedics and get prescription for 100 mg.  9. Personal history of  tobacco use  -     PR VISIT TO DISCUSS LUNG CA SCREEN W LDCT  -     CT Lung Screen (Initial/Annual/Baseline); Future     Orders Placed This Encounter    CT Lung Screen (Initial/Annual/Baseline)     Age: Patient is 72 y.o.    Smoking History:   Tobacco Use      Smoking status: Every Day  Packs/day: 0.25        Years: 0.3 packs/day for 52.3 years (13.1 ttl pk-yrs)        Types: Cigarettes        Start date: 6        Passive exposure: Current      Smokeless tobacco: Never        Date of last lung cancer screening: 06/09/2021     Standing Status:   Future     Standing Expiration Date:   12/21/2023     Order Specific Question:   Is there documentation of shared decision making?     Answer:   Yes     Order Specific Question:   Is this a low dose CT or a routine CT?     Answer:   Low Dose CT [1]     Order Specific Question:   Is this the first (baseline) CT or an annual exam?     Answer:   Annual [2]     Order Specific Question:   Does the patient show any signs or symptoms of lung cancer?     Answer:   No     Order Specific Question:   Smoking Status?     Answer:   Every Day [1]     Order Specific Question:   Pack Years     Answer:   58     Order Specific Question:   Has the patient been exposed to a high level of radon?     Answer:   No [2]     Order Specific Question:   Has the patient been occupationally exposed to agents that are carcinogens targeting the lungs?     Answer:   No    CBC with Auto Differential     Standing Status:   Future     Standing Expiration Date:   06/20/2023    Comprehensive Metabolic Panel     Standing Status:   Future     Standing Expiration Date:   06/20/2023    Hemoglobin A1C     Standing Status:   Future     Standing Expiration Date:   06/20/2023    Lipid Panel     Standing Status:   Future     Standing Expiration Date:   06/20/2023    Urinalysis with Reflex to Culture     Standing Status:   Future     Standing Expiration Date:   06/20/2023     Order Specific Question:    SPECIFY(EX-CATH,MIDSTREAM,CYSTO,ETC)?     Answer:   mid stream    PR VISIT TO DISCUSS LUNG CA SCREEN W LDCT    gabapentin (NEURONTIN) 300 MG capsule     Sig: TAKE ONE capsule BY MOUTH DAILY FOR 7 DAYS, THEN ONE capule TWICE DAILY FOR 7 DAYS, THEN ONE capsule THREE TIMES DAILY THEREAFTER    cyanocobalamin 1000 MCG/ML injection     Sig: Inject 1 mL into the muscle every 30 days     Dispense:  3 mL     Refill:  3        Return in about 3 months (around 09/20/2022) for Medicare wellness.     Patient Instructions        Learning About Lung Cancer Screening  What is screening for lung cancer?     Lung cancer screening is a way to find some lung cancers early, before a person has any symptoms of the cancer.  Lung cancer screening  may help those who have the highest risk for lung cancer--people age 29 and older who are or were heavy smokers. For most people, who aren't at increased risk, screening for lung cancer probably isn't helpful.  Screening won't prevent cancer. And it may not find all lung cancers. Lung cancer screening may lower the risk of dying from lung cancer in a small number of people.  How is it done?  Lung cancer screening is done with a low-dose CT (computed tomography) scan. A CT scan uses X-rays, or radiation, to make detailed pictures of your body. Experts recommend that screening be done in medical centers that focus on finding and treating lung cancer.  Who is screening recommended for?  Lung cancer screening is recommended for people age 78 and older who are or were heavy smokers. That means people with a smoking history of at least 20 pack years. A pack year is a way to measure how heavy a smoker you are or were.  To figure out your pack years, multiply how many packs a day on average (assuming 20 cigarettes per pack) you have smoked by how many years you have smoked. For example:  If you smoked 1 pack a day for 20 years, that's 1 times 20. So you have a smoking history of 20 pack years.  If you  smoked 2 packs a day for 10 years, that's 2 times 10. So you have a smoking history of 20 pack years.  Experts agree that screening is for people who have a high risk of lung cancer. But experts don't agree on what high risk means. Some say people age 74 or older with at least a 20-pack-year smoking history are high risk. Others say it's people age 39 or older with a 30-pack-year history.  To see if you could benefit from screening, first find out if you are at high risk for lung cancer. Your doctor can help you decide your lung cancer risk.  What are the risks of screening?  CT screening for lung cancer isn't perfect. It can show an abnormal result when it turns out there wasn't any cancer. This is called a false-positive result. This means you may need more tests to make sure you don't have cancer. These tests can be harmful and cause a lot of worry.  These tests may include more CT scans and invasive testing like a lung biopsy. In a biopsy, the doctor takes a sample of tissue from inside your lung so it can be looked at under a microscope. A biopsy is the only way to tell if you have lung cancer. If the biopsy finds cancer, you and your doctor will have to decide how or whether to treat it.  Some lung cancers found on CT scans are harmless and would not have caused a problem if they had not been found through screening. But because doctors can't tell which ones will turn out to be harmless, most will be treated. This means that you may get treatment--including surgery, radiation, or chemotherapy--that you don't need.  There is a risk of damage to cells or tissue from being exposed to radiation, including the small amounts used in CTs, X-rays, and other medical tests. Over time, exposure to radiation may cause cancer and other health problems. But in most cases, the risk of getting cancer from being exposed to small amounts of radiation is low. It's not a reason to avoid these tests for most people.  What are the  benefits of  screening?  Your scan may be normal (negative).  For some people who are at higher risk, screening lowers the chance of dying of lung cancer. How much and how long you smoked helps to determine your risk level. Screening can find some cancers early, when treatment may be more likely to work.  What happens after screening?  The results of your CT scan will be sent to your doctor. Someone from your care team will explain the results of your scan and answer any questions you may have. If you need any follow-up, he or she will help you understand what to do next.  After a lung cancer screening, you can go back to your usual activities right away.  A lung cancer screening test can't tell if you have lung cancer. If your results are positive, your doctor can't tell whether an abnormal finding is a harmless nodule, cancer, or something else without doing more tests.  What can you do to help prevent lung cancer?  Some lung cancers can't be prevented. But if you smoke, quitting smoking is the best step you can take to prevent lung cancer. If you want to quit, your doctor can recommend medicines or other ways to help.  Follow-up care is a key part of your treatment and safety. Be sure to make and go to all appointments, and call your doctor if you are having problems. It's also a good idea to know your test results and keep a list of the medicines you take.  Where can you learn more?  Go to [employer] and enter Q940 to learn more about "Learning About Lung Cancer Screening."  Current as of: December 13, 2021               Content Version: 14.0   2006-2024 [employer].   Care instructions adapted under license by [facility]. If you have questions about a medical condition or this instruction, always ask your healthcare professional. [employer] disclaims any warranty or liability for your use of this information.          Health Maintenance Due   Topic Date Due     Annual Wellness Visit (Medicare Advantage)  Never done    Pneumococcal 65+ years Vaccine (2 of 2 - PCV) 03/02/2022      The patient (or guardian, if applicable) and other individuals in attendance with the patient were advised that Artificial Intelligence will be utilized during this visit to record and process the conversation to generate a clinical note. The patient (or guardian, if applicable) and other individuals in attendance at the appointment consented to the use of AI, including the recording.                 Aspects of this note may have been generated using voice recognition software. Despite editing, there may be unrecognized errors.    An electronic signature was used to authenticate this note.  -- Don Perking, MD Discussed with the patient the current USPSTF guidelines released April 28, 2019 for screening for lung cancer.    For adults aged 37 to 8 years who have a 20 pack-year smoking history and currently smoke or have quit within the past 15 years the grade B recommendation is to:  Screen for lung cancer with low-dose computed tomography (LDCT) every year.  Stop screening once a person has not smoked for 15 years or has a health problem that limits life expectancy or the ability to have lung surgery.  The patient  reports that she has been smoking cigarettes. She started smoking about 52 years ago. She has a 13.1 pack-year smoking history. She has been exposed to tobacco smoke. She has never used smokeless tobacco.. Discussed with patient the risks and benefits of screening, including over-diagnosis, false positive rate, and total radiation exposure.  The patient currently exhibits no signs or symptoms suggestive of lung cancer.  Discussed with patient the importance of compliance with yearly annual lung cancer screenings and willingness to undergo diagnosis and treatment if screening scan is positive.  In addition, the patient was counseled regarding the importance of remaining smoke  free and/or total smoking cessation.    Also reviewed the following if the patient has Medicare that as of March 31, 2020, Medicare only covers LDCT screening in patients aged 50-77 with at least a 20 pack-year smoking history who currently smoke or have quit in the last 15 years

## 2022-06-20 NOTE — Progress Notes (Signed)
"  Have you been to the ER, urgent care clinic since your last visit?  Hospitalized since your last visit?"    NO    "Have you seen or consulted any other health care providers outside of [facility] since your last visit?"    NO            Click Here for Release of Records Request

## 2022-06-20 NOTE — Assessment & Plan Note (Signed)
Gabapentin 300 mg is too strong for her, advised to call orthopedics and get prescription for 100 mg.

## 2022-07-19 ENCOUNTER — Inpatient Hospital Stay: Admit: 2022-07-19 | Payer: MEDICARE | Primary: Internal Medicine

## 2022-07-20 LAB — COMPREHENSIVE METABOLIC PANEL
ALT: 5 IU/L (ref 0–32)
AST: 14 IU/L (ref 0–40)
Albumin/Globulin Ratio: 1.4 (ref 1.2–2.2)
Albumin: 4.3 g/dL (ref 3.9–4.9)
Alkaline Phosphatase: 101 IU/L (ref 44–121)
BUN/Creatinine Ratio: 11 — ABNORMAL LOW (ref 12–28)
BUN: 9 mg/dL (ref 8–27)
CO2: 25 mmol/L (ref 20–29)
Calcium: 9.5 mg/dL (ref 8.7–10.3)
Chloride: 106 mmol/L (ref 96–106)
Creatinine: 0.81 mg/dL (ref 0.57–1.00)
Est, Glom Filt Rate: 79 mL/min/{1.73_m2} (ref >59)
Globulin, Total: 3.1 g/dL (ref 1.5–4.5)
Glucose: 98 mg/dL (ref 70–99)
Potassium: 4.4 mmol/L (ref 3.5–5.2)
Sodium: 144 mmol/L (ref 134–144)
Total Bilirubin: 0.3 mg/dL (ref 0.0–1.2)
Total Protein: 7.4 g/dL (ref 6.0–8.5)

## 2022-07-20 LAB — CBC WITH AUTO DIFFERENTIAL
Basophils %: 0 %
Basophils Absolute: 0 10*3/uL (ref 0.0–0.2)
Eosinophils %: 5 %
Eosinophils Absolute: 0.3 10*3/uL (ref 0.0–0.4)
Hematocrit: 36.2 % (ref 34.0–46.6)
Hemoglobin: 12.4 g/dL (ref 11.1–15.9)
Immature Grans (Abs): 0 10*3/uL (ref 0.0–0.1)
Immature Granulocytes %: 0 %
Lymphocytes %: 40 %
Lymphocytes Absolute: 2.1 10*3/uL (ref 0.7–3.1)
MCH: 31 pg (ref 26.6–33.0)
MCHC: 34.3 g/dL (ref 31.5–35.7)
MCV: 91 fL (ref 79–97)
Monocytes %: 7 %
Monocytes Absolute: 0.4 10*3/uL (ref 0.1–0.9)
Neutrophils %: 48 %
Neutrophils Absolute: 2.5 10*3/uL (ref 1.4–7.0)
Platelets: 270 10*3/uL (ref 150–450)
RBC: 4 x10E6/uL (ref 3.77–5.28)
RDW: 13.2 % (ref 11.7–15.4)
WBC: 5.2 10*3/uL (ref 3.4–10.8)

## 2022-07-20 LAB — LIPID PANEL
Cholesterol, Total: 202 mg/dL — ABNORMAL HIGH (ref 100–199)
HDL: 63 mg/dL (ref >39)
LDL Cholesterol: 123 mg/dL — ABNORMAL HIGH (ref 0–99)
Triglycerides: 87 mg/dL (ref 0–149)
VLDL Cholesterol Calculated: 16 mg/dL (ref 5–40)

## 2022-07-20 LAB — HEMOGLOBIN A1C: Hemoglobin A1C: 6.1 % — ABNORMAL HIGH (ref 4.8–5.6)

## 2022-07-21 ENCOUNTER — Inpatient Hospital Stay: Admit: 2022-07-21 | Payer: MEDICARE | Attending: Internal Medicine | Primary: Internal Medicine

## 2022-07-21 DIAGNOSIS — Z87891 Personal history of nicotine dependence: Secondary | ICD-10-CM

## 2022-07-21 NOTE — Other (Signed)
Called and spoke with pt regarding results.  Pt verbalized understanding of her instructions. Thank you

## 2022-08-08 NOTE — Telephone Encounter (Signed)
POPULATION HEALTH CLINICAL PHARMACY: ADHERENCE REVIEW  Identified care gap per [place] fills with  [facility]  Pharmacy: ACE/ARB adherence    Medicare and Retirement Dual Special Needs Plan - MRDSNP  MA-PCPi  Per insurer report, LIS-1 - may be able to receive up to a 100-day supply for the same cost as a 30-day supply.    ASSESSMENT    ACE/ARB ADHERENCE    Insurance Records claims through  06.06.24  (Prior Year PDC = not reported; YTD PDC = 71%; Potential Fail Date: 07.12.24):   LISINOP/HCTZ TAB 20-12.5 MG last filled on 02.05.24 for 90 day supply. Next refill due: 05.05.24    Prescribed sig: 1 tablet/capsule daily    Per Reconcile Dispense History and Insurer Portal: last filled on 2.05.24 for 90 day supply.     Per [facility]: last picked up on 02.05.24 for 90 day supply. Billed through [place]. 0 refills remaining.    Per med list lisinopril-hydroCHLOROthiazide (PRINZIDE;ZESTORETIC) 10-12.5 MG is the strength listed.   No mention of this medication in OV note from 05.01.24 with internal medicine, Essential hypertension, benign  Comments:  Blood pressure improved.  Continue amlodipine 5 mg twice a day.    BP Readings from Last 3 Encounters:   06/20/22 116/81   05/09/22 (!) 142/94   07/27/21 (!) 140/89     Estimated Creatinine Clearance: 60 mL/min (based on SCr of 0.81 mg/dL).  Lab Results   Component Value Date    CREATININE 0.81 07/19/2022     Lab Results   Component Value Date    K 4.4 07/19/2022       PLAN  The following are interventions that have been identified:   Patient overdue refilling LISINOP/HCTZ TAB 20-12.5 MG and prescribed by an outside/ non [facility] [place] Provider AMANDA BAKER .    Outreach:    Routing to Pharmacist:  Routing for clarification of LISINOP/HCTZ TAB 20-12.5 MG.   Pt last filled LISINOP/HCTZ TAB 20-12.5 MG on 02.05.24 for 90 ds by [facility]AKER.  Per med list lisinopril-hydroCHLOROthiazide (PRINZIDE;ZESTORETIC) 10-12.5 MG is the strength listed.   No mention of this medication in OV  note from 05.01.24 with internal medicine, Essential hypertension, benign Comments: Blood pressure improved.  Continue amlodipine 5 mg twice a day.    Last Visit: 05.01.24  Next Visit: 08.07.24    Earmon Phoenix, CPhT  Population Health Support Specialist   [facility]   [phone] option 1

## 2022-08-13 NOTE — Telephone Encounter (Signed)
It seems like patient is lisinopril 10/12.5 and amlodipine 5 mg.  I am not sure why nurse practitioner from gastroenterologist filled lisinopril 20/12.5.

## 2022-08-13 NOTE — Telephone Encounter (Signed)
Don Perking, MD,    There is a discrepancy for a patient's blood pressure medication. Could you please clarify?    Per office visit notes and medication lists we have patient listed for:   lisinopril-hydroCHLOROthiazide (PRINZIDE;ZESTORETIC) 10-12.5 MG     2. Her most recently filled prescription was for:  LISINOP/HCTZ TAB 20-12.5 MG last filled on 02.05.24 for 90 day supply     This was prescribed by a Waunita Schooner NP- from a Gastroenterologist office.   There are no current refills remaining for this strength.     There is no mention in any office visit notes since that date of fill of increase of dose- only notes pertaining to her amlodipine.    She currently does not have any active prescriptions for Lisinopril/Hctz on file with her pharmacy.  Could you please clarify which strength you wish patient to continue, and please send a new prescription for it to   [facility] - [place], [place] - [address] [place] [phone] Carmon Ginsberg [phone]       Thank you, Please let me know if you have any questions,  Priscille Heidelberg  PharmD, [facility]  [facility] Pharmacist  [facility]  Department: [phone], option 1

## 2022-08-14 MED ORDER — LISINOPRIL-HYDROCHLOROTHIAZIDE 10-12.5 MG PO TABS
10-12.5 MG | ORAL_TABLET | Freq: Every day | ORAL | 1 refills | Status: DC
Start: 2022-08-14 — End: 2023-02-18

## 2022-08-14 NOTE — Telephone Encounter (Addendum)
Thank you so much for clarifying, she does not have a current active prescription (for either strength).    I have pended the Lisinopril/hctz 10/12.5 mg for your approval to be sent to [facility] drug.    Thank you so very much!    Priscille Heidelberg  PharmD, [facility]  Population Health Pharmacist  [facility]  Department: [phone], option 1    Requested Prescriptions     Pending Prescriptions Disp Refills    lisinopril-hydroCHLOROthiazide (PRINZIDE;ZESTORETIC) 10-12.5 MG per tablet 100 tablet 1     Sig: Take 1 tablet by mouth daily       Thank you, Don Perking, MD!    For Pharmacy Admin Tracking Only    Program: Pop Health  CPA in place:  No  Recommendation Provided To: Provider: 1 via Note to Provider  Intervention Detail: Adherence Monitoring: 1 and New Rx: 1, reason: Improve Adherence  Intervention Accepted By: Provider: 1  Gap Closed?: Yes   Time Spent (min): 30

## 2022-09-20 ENCOUNTER — Encounter

## 2022-09-26 ENCOUNTER — Encounter: Admit: 2022-09-26 | Discharge: 2022-09-26 | Payer: MEDICARE | Attending: Internal Medicine | Primary: Internal Medicine

## 2022-09-26 DIAGNOSIS — Z0000 Encounter for general adult medical examination without abnormal findings: Secondary | ICD-10-CM

## 2022-09-26 MED ORDER — CYANOCOBALAMIN 1000 MCG/ML IJ SOLN
1000 | INTRAMUSCULAR | 3 refills | Status: AC
Start: 2022-09-26 — End: ?

## 2022-09-26 MED ORDER — ROSUVASTATIN CALCIUM 5 MG PO TABS
5 | ORAL_TABLET | Freq: Every day | ORAL | 1 refills | Status: AC
Start: 2022-09-26 — End: ?

## 2022-09-26 NOTE — Patient Instructions (Signed)
Learning About Mild Cognitive Impairment (MCI)  What is mild cognitive impairment (MCI)?     It's common to forget things sometimes as we get older. But some older people have memory loss that's more than normal aging. It's called mild cognitive impairment, or MCI. It is not the same as dementia.  People with the condition often know that their memory or mental function has changed. Tests may show some loss. But their minds work well overall. They can carry out daily tasks that are normal for them.  People with MCI have a higher chance of one day getting dementia. But not all people who have it will get dementia. Some people may stay the same over time.  What are the symptoms?  People with MCI have more memory loss than what occurs with normal aging. They may have increasing trouble with recalling words and keeping up with conversations. They may also have trouble remembering important events and making decisions.  What puts you at risk?  The risk of getting MCI increases with age. Having high blood pressure or having a family history of MCI may also increase your risk.  How is it diagnosed?  Your doctor will do a physical exam.  You may be asked questions to check your memory and other mental skills. Your doctor may also talk to close friends and family members. This can help the doctor figure out how your memory and other mental skills have changed.  You may get blood tests and tests that look at your brain.  These questions and tests can make sure you don't have other conditions that can cause symptoms like MCI. These include depression, sleep problems, and side effects from medicines.  How is it treated?  There are no medicines to treat MCI or to keep it from progressing to dementia. But treating conditions like high blood pressure and diabetes may help. A person with MCI needs routine follow-up visits with their doctor to check on changes in the person's mental skills.  How can you care for yourself at  home?  Keeping your body active can help slow MCI. Exercises like walking can help. Try to stay active mentally too. Read or do things like crossword puzzles if you enjoy doing them.  If you need help coping with MCI, you may want to get support from family, friends, a support group, or a counselor who works with people who have MCI.  Though the future isn't always clear, it can be good to plan ahead with instructions for your care. These are called advanced directives. Having a plan can help make sure that you get the care you want.  Current as of: February 07, 2022  Content Version: 14.1   2006-2024 [employer].   Care instructions adapted under license by [facility]. If you have questions about a medical condition or this instruction, always ask your healthcare professional. [employer] disclaims any warranty or liability for your use of this information.           Learning About Being Active as an Older Adult  Why is being active important as you get older?     Being active is one of the best things you can do for your health. And it's never too late to start. Being active--or getting active, if you aren't already--has definite benefits. It can:  Give you more energy,  Keep your mind sharp.  Improve balance to reduce your risk of falls.  Help you manage chronic illness with fewer  medicines.  No matter how old you are, how fit you are, or what health problems you have, there is a form of activity that will work for you. And the more physical activity you can do, the better your overall health will be.  What kinds of activity can help you stay healthy?  Being more active will make your daily activities easier. Physical activity includes planned exercise and things you do in daily life. There are four types of activity:  Aerobic.  Doing aerobic activity makes your heart and lungs strong.  Includes walking, dancing, and gardening.  Aim for at least 2 hours spread throughout the  week.  It improves your energy and can help you sleep better.  Muscle-strengthening.  This type of activity can help maintain muscle and strengthen bones.  Includes climbing stairs, using resistance bands, and lifting or carrying heavy loads.  Aim for at least twice a week.  It can help protect the knees and other joints.  Stretching.  Stretching gives you better range of motion in joints and muscles.  Includes upper arm stretches, calf stretches, and gentle yoga.  Aim for at least twice a week, preferably after your muscles are warmed up from other activities.  It can help you function better in daily life.  Balancing.  This helps you stay coordinated and have good posture.  Includes heel-to-toe walking, tai chi, and certain types of yoga.  Aim for at least 3 days a week.  It can reduce your risk of falling.  Even if you have a hard time meeting the recommendations, it's better to be more active than less active. All activity done in each category counts toward your weekly total. You'd be surprised how daily things like carrying groceries, keeping up with grandchildren, and taking the stairs can add up.  What keeps you from being active?  If you've had a hard time being more active, you're not alone. Maybe you remember being able to do more. Or maybe you've never thought of yourself as being active. It's frustrating when you can't do the things you want. Being more active can help. What's holding you back?  Getting started.  Have a goal, but break it into easy tasks. Small steps build into big accomplishments.  Staying motivated.  If you feel like skipping your activity, remember your goal. Maybe you want to move better and stay independent. Every activity gets you one step closer.  Not feeling your best.  Start with 5 minutes of an activity you enjoy. Prove to yourself you can do it. As you get comfortable, increase your time.  You may not be where you want to be. But you're in the process of getting there.  Everyone starts somewhere.  How can you find safe ways to stay active?  Talk with your doctor about any physical challenges you're facing. Make a plan with your doctor if you have a health problem or aren't sure how to get started with activity.  If you're already active, ask your doctor if there is anything you should change to stay safe as your body and health change.  If you tend to feel dizzy after you take medicine, avoid activity at that time. Try being active before you take your medicine. This will reduce your risk of falls.  If you plan to be active at home, make sure to clear your space before you get started. Remove things like TV cords, coffee tables, and throw rugs. It's safest to have plenty of  space to move freely.  The key to getting more active is to take it slow and steady. Try to improve only a little bit at a time. Pick just one area to improve on at first. And if an activity hurts, stop and talk to your doctor.  Where can you learn more?  Go to [employer] and enter P600 to learn more about "Learning About Being Active as an Older Adult."  Current as of: July 24, 2021  Content Version: 14.1   2006-2024 [employer].   Care instructions adapted under license by [facility]. If you have questions about a medical condition or this instruction, always ask your healthcare professional. [employer] disclaims any warranty or liability for your use of this information.           Learning About Vision Tests  What are vision tests?     The four most common vision tests are visual acuity tests, refraction, visual field tests, and color vision tests.  Visual acuity (sharpness) tests  These tests are used:  To see if you need glasses or contact lenses.  To monitor an eye problem.  To check an eye injury.  Visual acuity tests are done as part of routine exams. You may also have this test when you get your driver's license or apply for some types of  jobs.  Visual field tests  These tests are used:  To check for vision loss in any area of your range of vision.  To screen for certain eye diseases.  To look for nerve damage after a stroke, head injury, or other problem that could reduce blood flow to the brain.  Refraction and color tests  A refraction test is done to find the right prescription for glasses and contact lenses.  A color vision test is done to check for color blindness.  Color vision is often tested as part of a routine exam. You may also have this test when you apply for a job where recognizing different colors is important, such as truck driving, Optician, dispensing, or the [employer].  How are vision tests done?  Visual acuity test   You cover one eye at a time.  You read aloud from a wall chart across the room.  You read aloud from a small card that you hold in your hand.  Refraction   You look into a special device.  The device puts lenses of different strengths in front of each eye to see how strong your glasses or contact lenses need to be.  Visual field tests   Your doctor may have you look through special machines.  Or your doctor may simply have you stare straight ahead while they move a finger into and out of your field of vision.  Color vision test   You look at pieces of printed test patterns in various colors. You say what number or symbol you see.  Your doctor may have you trace the number or symbol using a pointer.  How do these tests feel?  There is very little chance of having a problem from this test. If dilating drops are used for a vision test, they may make the eyes sting and cause a medicine taste in the mouth.  Follow-up care is a key part of your treatment and safety. Be sure to make and go to all appointments, and call your doctor if you are having problems. It's also a good idea to know your test results and keep a list of the  medicines you take.  Where can you learn more?  Go to [employer] and enter G551  to learn more about "Learning About Vision Tests."  Current as of: July 24, 2021  Content Version: 14.1   2006-2024 [employer].   Care instructions adapted under license by [facility]. If you have questions about a medical condition or this instruction, always ask your healthcare professional. [employer] disclaims any warranty or liability for your use of this information.           Eating Healthy Foods: Care Instructions  With every meal, you can make healthy food choices. Try to eat a variety of fruits, vegetables, whole grains, lean proteins, and low-fat dairy products. This can help you get the right balance of nutrients, including vitamins and minerals. Small changes add up over time. You can start by adding one healthy food to your meals each day.    Try to make half your plate fruits and vegetables, one-fourth whole grains, and one-fourth lean proteins. Try including dairy with your meals.   Eat more fruits and vegetables. Try to have them with most meals and snacks.   Foods for healthy eating        Fruits   These can be fresh, frozen, canned, or dried.  Try to choose whole fruit rather than fruit juice.  Eat a variety of colors.        Vegetables   These can be fresh, frozen, canned, or dried.  Beans, peas, and lentils count too.        Whole grains   Choose whole-grain breads, cereals, and noodles.  Try brown rice.        Lean proteins   These can include lean meat, poultry, fish, and eggs.  You can also have tofu, beans, peas, lentils, nuts, and seeds.        Dairy   Try milk, yogurt, and cheese.  Choose low-fat or fat-free when you can.  If you need to, use lactose-free milk or fortified plant-based milk products, such as soy milk.        Water   Drink water when you're thirsty.  Limit sugar-sweetened drinks, including soda, fruit drinks, and sports drinks.  Where can you learn more?  Go to [employer] and enter T756 to learn more about "Eating  Healthy Foods: Care Instructions."  Current as of: November 08, 2021  Content Version: 14.1   2006-2024 [employer].   Care instructions adapted under license by [facility]. If you have questions about a medical condition or this instruction, always ask your healthcare professional. [employer] disclaims any warranty or liability for your use of this information.           Starting a Weight Loss Plan: Care Instructions  Overview     It can be a challenge to lose weight. But your doctor can help you make a weight-loss plan that meets your needs.  You don't have to make a lot of big changes at once. A better idea might be to focus on small changes and stick with them. When those changes become habit, you can add a few more changes.  Some people find it helpful to take an exercise or nutrition class. If you have questions, ask your doctor about seeing a registered dietitian or an exercise specialist. You might also think about joining a weight-loss support group.  If you're not ready to make changes right now, try to pick a date in the future. Then make  an appointment with your doctor to talk about when and how you'll get started with a plan.  Follow-up care is a key part of your treatment and safety. Be sure to make and go to all appointments, and call your doctor if you are having problems. It's also a good idea to know your test results and keep a list of the medicines you take.  How can you care for yourself at home?  Set realistic goals. Many people expect to lose much more weight than is likely. A weight loss of 5% to 10% of your body weight may be enough to improve your health.  Get family and friends involved to provide support. Talk to them about why you are trying to lose weight, and ask them to help. They can help by participating in exercise and having meals with you, even if they may be eating something different.  Find what works best for you. If you do not have time or do  not like to cook, a program that offers meal replacement bars or shakes may be better for you. Or if you like to prepare meals, finding a plan that includes daily menus and recipes may be best.  Ask your doctor about other health professionals who can help you achieve your weight loss goals.  A dietitian can help you make healthy changes in your diet.  An exercise specialist or personal trainer can help you develop a safe and effective exercise program.  A counselor or psychiatrist can help you cope with issues such as depression, anxiety, or family problems that can make it hard to focus on weight loss.  Consider joining a support group for people who are trying to lose weight. Your doctor can suggest groups in your area.  Where can you learn more?  Go to [employer] and enter U357 to learn more about "Starting a Weight Loss Plan: Care Instructions."  Current as of: November 08, 2021  Content Version: 14.1   2006-2024 [employer].   Care instructions adapted under license by [facility]. If you have questions about a medical condition or this instruction, always ask your healthcare professional. [employer] disclaims any warranty or liability for your use of this information.           Advance Directives: Care Instructions  Overview  An advance directive is a legal way to state your wishes at the end of your life. It tells your family and your doctor what to do if you can't say what you want.  There are two main types of advance directives. You can change them any time your wishes change.  Living will.  This form tells your family and your doctor your wishes about life support and other treatment. The form is also called a declaration.  Medical power of attorney.  This form lets you name a person to make treatment decisions for you when you can't speak for yourself. This person is called a health care agent (health care proxy, health care surrogate). The form is  also called a durable power of attorney for health care.  If you do not have an advance directive, decisions about your medical care may be made by a family member, or by a doctor or a judge who doesn't know you.  It may help to think of an advance directive as a gift to the people who care for you. If you have one, they won't have to make tough decisions by themselves.  For more information, including  forms for your state, see the [employer] website ([employer]cy).  Follow-up care is a key part of your treatment and safety. Be sure to make and go to all appointments, and call your doctor if you are having problems. It's also a good idea to know your test results and keep a list of the medicines you take.  What should you include in an advance directive?  Many states have a unique advance directive form. (It may ask you to address specific issues.) Or you might use a universal form that's approved by many states.  If your form doesn't tell you what to address, it may be hard to know what to include in your advance directive. Use the questions below to help you get started.  Who do you want to make decisions about your medical care if you are not able to?  What life-support measures do you want if you have a serious illness that gets worse over time or can't be cured?  What are you most afraid of that might happen? (Maybe you're afraid of having pain, losing your independence, or being kept alive by machines.)  Where would you prefer to die? (Your home? A hospital? A nursing home?)  Do you want to donate your organs when you die?  Do you want certain religious practices performed before you die?  When should you call for help?  Be sure to contact your doctor if you have any questions.  Where can you learn more?  Go to [employer] and enter R264 to learn more about "Advance Directives: Care Instructions."  Current as of: January 04, 2022  Content Version:  14.1   2006-2024 [employer], [employer].   Care instructions adapted under license by [facility]. If you have questions about a medical condition or this instruction, always ask your healthcare professional. [employer] Inc[employer] disclaims any warranty or liability for your use of this information.           A Healthy Heart: Care Instructions  Overview     Coronary artery disease, also called heart disease, occurs when a substance called plaque builds up in the vessels that supply oxygen-rich blood to your heart muscle. This can narrow the blood vessels and reduce blood flow. A heart attack happens when blood flow is completely blocked. A high-fat diet, smoking, and other factors increase the risk of heart disease.  Your doctor has found that you have a chance of having heart disease. A heart-healthy lifestyle can help keep your heart healthy and prevent heart disease. This lifestyle includes eating healthy, being active, staying at a weight that's healthy for you, and not smoking or using tobacco. It also includes taking medicines as directed, managing other health conditions, and trying to get a healthy amount of sleep.  Follow-up care is a key part of your treatment and safety. Be sure to make and go to all appointments, and call your doctor if you are having problems. It's also a good idea to know your test results and keep a list of the medicines you take.  How can you care for yourself at home?  Diet   Use less salt when you cook and eat. This helps lower your blood pressure. Taste food before salting. Add only a little salt when you think you need it. With time, your taste buds will adjust to less salt.    Eat fewer snack items, fast foods, canned soups, and other high-salt, high-fat, processed foods.    Read food labels and try to  avoid saturated and trans fats. They increase your risk of heart disease by raising cholesterol levels.    Limit the amount of solid fat--butter, margarine, and  shortening--you eat. Use olive, peanut, or canola oil when you cook. Bake, broil, and steam foods instead of frying them.    Eat a variety of fruit and vegetables every day. Dark green, deep orange, red, or yellow fruits and vegetables are especially good for you. Examples include spinach, carrots, peaches, and berries.    Foods high in fiber can reduce your cholesterol and provide important vitamins and minerals. High-fiber foods include whole-grain cereals and breads, oatmeal, beans, brown rice, citrus fruits, and apples.    Eat lean proteins. Heart-healthy proteins include seafood, lean meats and poultry, eggs, beans, peas, nuts, seeds, and soy products.    Limit drinks and foods with added sugar. These include candy, desserts, and soda pop.   Heart-healthy lifestyle   If your doctor recommends it, get more exercise. For many people, walking is a good choice. Or you may want to swim, bike, or do other activities. Bit by bit, increase the time you're active every day. Try for at least 30 minutes on most days of the week.    Try to quit or cut back on using tobacco and other nicotine products. This includes smoking and vaping. If you need help quitting, talk to your doctor about stop-smoking programs and medicines. These can increase your chances of quitting for good. Quitting is one of the most important things you can do to protect your heart. It is never too late to quit. Try to avoid secondhand smoke too.    Stay at a weight that's healthy for you. Talk to your doctor if you need help losing weight.    Try to get 7 to 9 hours of sleep each night.    Limit alcohol to 2 drinks a day for men and 1 drink a day for women. Too much alcohol can cause health problems.    Manage other health problems such as diabetes, high blood pressure, and high cholesterol. If you think you may have a problem with alcohol or drug use, talk to your doctor.   Medicines   Take your medicines exactly as prescribed. Call your  doctor if you think you are having a problem with your medicine.    If your doctor recommends aspirin, take the amount directed each day. Make sure you take aspirin and not another kind of pain reliever, such as acetaminophen (Tylenol).   When should you call for help?   Call 911 if you have symptoms of a heart attack. These may include:   Chest pain or pressure, or a strange feeling in the chest.    Sweating.    Shortness of breath.    Pain, pressure, or a strange feeling in the back, neck, jaw, or upper belly or in one or both shoulders or arms.    Lightheadedness or sudden weakness.    A fast or irregular heartbeat.   After you call 911, the operator may tell you to chew 1 adult-strength or 2 to 4 low-dose aspirin. Wait for an ambulance. Do not try to drive yourself.  Watch closely for changes in your health, and be sure to contact your doctor if you have any problems.  Where can you learn more?  Go to [employer] and enter F075 to learn more about "A Healthy Heart: Care Instructions."  Current as of: August 12, 2021  Content Version: 14.1  2006-2024 [employer].   Care instructions adapted under license by [facility]. If you have questions about a medical condition or this instruction, always ask your healthcare professional. [employer] disclaims any warranty or liability for your use of this information.      Personalized Preventive Plan for KARIYAH CREST - 09/26/2022  Medicare offers a range of preventive health benefits. Some of the tests and screenings are paid in full while other may be subject to a deductible, co-insurance, and/or copay.    Some of these benefits include a comprehensive review of your medical history including lifestyle, illnesses that may run in your family, and various assessments and screenings as appropriate.    After reviewing your medical record and screening and assessments performed today your provider may have ordered  immunizations, labs, imaging, and/or referrals for you.  A list of these orders (if applicable) as well as your Preventive Care list are included within your After Visit Summary for your review.    Other Preventive Recommendations:    A preventive eye exam performed by an eye specialist is recommended every 1-2 years to screen for glaucoma; cataracts, macular degeneration, and other eye disorders.  A preventive dental visit is recommended every 6 months.  Try to get at least 150 minutes of exercise per week or 10,000 steps per day on a pedometer .  Order or download the FREE "Exercise & Physical Activity: Your Everyday Guide" from [employer]. Call [phone] or search [employer] online.  You need 1200-1500 mg of calcium and 1000-2000 IU of vitamin D per day. It is possible to meet your calcium requirement with diet alone, but a vitamin D supplement is usually necessary to meet this goal.  When exposed to the sun, use a sunscreen that protects against both UVA and UVB radiation with an SPF of 30 or greater. Reapply every 2 to 3 hours or after sweating, drying off with a towel, or swimming.  Always wear a seat belt when traveling in a car. Always wear a helmet when riding a bicycle or motorcycle.

## 2022-09-26 NOTE — Progress Notes (Signed)
Medicare Annual Wellness Visit    Tracy Jordan is here for Medicare AWV    Assessment & Plan   Medicare annual wellness visit, subsequent  Advance directive discussed with patient  Essential hypertension, benign  -     Comprehensive Metabolic Panel; Future  -     Urinalysis with Reflex to Culture; Future  B12 deficiency  Comments:  Vitamin B12 level is very low at 126.  Advised B12 injection and also can take sublingual vitamin B12.  Orders:  -     CBC with Auto Differential; Future  -     cyanocobalamin 1000 MCG/ML injection; Inject 1 mL into the muscle every 30 days, Disp-3 mL, R-3Normal  Prediabetes  -     Hemoglobin A1C; Future  Hypercholesterolemia  -     Lipid Panel; Future  -     Thyroid Cascade Profile; Future  -     rosuvastatin (CRESTOR) 5 MG tablet; Take 1 tablet by mouth daily, Disp-90 tablet, R-1Normal  Encounter for screening mammogram for breast cancer  -     MAM TOMO DIGITAL SCREEN BILATERAL; Future  Recommendations for Preventive Services Due: see orders and patient instructions/AVS.  Recommended screening schedule for the next 5-10 years is provided to the patient in written form: see Patient Instructions/AVS.     Return in about 4 months (around 01/26/2023).     Subjective   History of Present Illness  The patient is a 72 year old female with a past medical history of hypertension, chronic hepatitis C, osteoporosis, hypercholesterolemia, and a right upper lobe nodule. She is here for a Medicare wellness visit and a 40-month follow-up.    She reports feeling well overall and maintains an active lifestyle, climbing approximately 30 steps daily. She has expressed a desire to quit smoking.    For her hypertension, she has been monitoring her blood pressure at home, which has been stable. She is managing well with her current doses of lisinopril and amlodipine.    Regarding her osteoporosis, she has chosen not to take Boniva. Her last bone density scan was on 06/08/2021.    She has a history of  hypercholesterolemia and has requested a low-dose medication for cholesterol management.    She also has prediabetes with an A1c of 6.2. No recent blood work has been done; her last blood work was on 07/19/2022, and her CBC is within normal limits.    She underwent a colonoscopy in 02/2022 and a mammogram in 07/2022. Her last mammogram was on 09/11/2021. She has not had a recent consultation with Dr. Laurice Record for her thyroid condition.    She experiences numbness in her left leg, which she attributes to a nerve issue or her back. She is currently undergoing therapy for her back and has been informed that if there is no improvement, an MRI will be performed. She reports no presence of blood clots.    She has a B12 deficiency and has been taking vitamin B12 supplements. She has one remaining B12 injection at home. She does not consume a lot of meat.  Labs on 07/19/2022 showed normal CMP.  Total cholesterol 292.  LDL 123.  LFTs within normal limit.'s A1c 6.1 decreased from 6.2.  CBC within normal limit.    She has declined the shingles vaccine and the pneumonia vaccine.    She needs to get her eyes checked because she has dry eyes.    SOCIAL HISTORY  She has been smoking for years.  IMMUNIZATIONS  She had 2 to 3 COVID-19 vaccines.       Patient's complete Health Risk Assessment and screening values have been reviewed and are found in Flowsheets. The following problems were reviewed today and where indicated follow up appointments were made and/or referrals ordered.    Positive Risk Factor Screenings with Interventions:                Inactivity:  On average, how many days per week do you engage in moderate to strenuous exercise (like a brisk walk)?: 0 days (!) Abnormal  On average, how many minutes do you engage in exercise at this level?: 0 min  Interventions:  Advised aerobic exercise and to stay active.    Poor Eating Habits/Diet:  Do you eat balanced/healthy meals regularly?: (!) No  Interventions:  Advised to  good eating habits    Abnormal BMI (obese):  Body mass index is 30.22 kg/m. (!) Abnormal  Interventions:  Advised portion control and weight reduction          Vision Screen:  Do you have difficulty driving, watching TV, or doing any of your daily activities because of your eyesight?: No  Have you had an eye exam within the past year?: (!) No  Interventions:   Patient encouraged to make appointment with their eye specialist      Advanced Directives:  Do you have a Living Will?: (!) No    Intervention:  has NO advanced directive - information provided        Tobacco Use:    Tobacco Use      Smoking status: Every Day        Packs/day: 0.25        Years: 0.3 packs/day for 52.6 years (13.1 ttl pk-yrs)        Types: Cigarettes        Start date: 35        Passive exposure: Current      Smokeless tobacco: Never     Interventions:  Patient has been encouraged to stop smoking    Had CT chest on 07/2022      Past Medical History:   Diagnosis Date    Arthritis     Chronic back pain greater than 3 months duration     Chronic pain     Decreased thyroid stimulating hormone level     Hepatitis C     Hypercholesterolemia     Hypertension     Osteoporosis     Vitamin D deficiency       ROS:  Constitutional:  Negative for chills and fever.   HENT:  Negative for congestion, ear pain, nosebleeds, sinus pain, sore throat and tinnitus.    Eyes:  Negative for redness.   Respiratory:  Negative for cough and shortness of breath.    Cardiovascular:  Negative for chest pain and palpitations.   Gastrointestinal:  Negative for abdominal pain, diarrhea, nausea and vomiting.   Endocrine: Negative for cold intolerance and polyuria.   Genitourinary:  Negative for dysuria and hematuria.   Musculoskeletal:  Negative for  neck pain. +ve back pain and left leg numbness  Skin:  Negative for rash.   Neurological:  Negative for dizziness and headaches.   Psychiatric/Behavioral: Negative.           Objective   Vitals:    09/26/22 0806   BP: 131/86    Site: Right Upper Arm   Position: Sitting   Cuff Size: Medium Adult  Pulse: 64   Resp: 16   Temp: 98.3 F (36.8 C)   SpO2: 99%   Weight: 73 kg (161 lb)   Height: 1.554 m (5' 1.2")      Body mass index is 30.22 kg/m.   Physical exam:   Vitals and nursing note reviewed.      Gen:  Not  in no acute distress. Well built and nourished.  HEENT:  Pink conjunctivae, PERLA, EOMI, hearing intact Mouth: Moist mucous membranes. No TPC  Neck:  Supple, without masses, thyroid not enlarged  Resp:  No accessory muscle use, clear breath sounds without wheezes rales or rhonchi  Card:  No murmurs, normal S1, S2 without thrills, bruits or peripheral edema.  Peripheral pulses felt bilaterally equal ,no calf muscle tenderness  Abd:  Soft, non-tender, non-distended, normoactive bowel sounds are present, no palpable organomegaly and no detectable hernias  Lymph:  No cervical or inguinal adenopathy  Musc:  No cyanosis or clubbing. -ve tenderness lumbar spine.  SLR negative.  Gait: Normal  Skin:  No rashes or ulcers, skin turgor is good  Neuro: Alert and oriented x 3. Cranial nerves are grossly intact, no focal motor weakness, follows commands appropriately  Psych:  Good insight, mood normal.          Assessment & Plan  1. Medicare wellness visit subsequent.  She is up to date with the colonoscopy, CT scan, and bone density scan. Her last mammogram was on 09/14/2021. A healthier diet and exercise were advised. Age-appropriate immunizations were discussed, but she does not want to have any vaccinations. Risks and benefits were discussed. She was advised to get an advanced directive. A mammogram will be ordered. She was also advised to have good eating habits and encouraged to stop smoking.    2. Hypertension.  Her blood pressure is controlled at 131/86. She will continue current medications.    3. B12 deficiency.  Her B12 level was very low at 126. She was advised to take a B12 shot every month. A refill for the B12 injection will be  sent to [facility].    4. Prediabetes.  Her A1c has decreased to 6.1 from 6.2. She will continue a low-carb diet.    5. Hypercholesterolemia.  LDL cholesterol elevated.  She will be started on a small dose of rosuvastatin 5 mg.    6. Right upper lobe nodule.  A CT scan showed a stable ground-glass nodule in the right upper lobe. This will be monitored once a year.    7. Nicotine dependence.  She was encouraged to stop smoking. Nicotine gum and lozenges were suggested as aids.    8. Osteoporosis.  She does not want to take any medications for it. Calcium, weight-bearing exercise, and vitamin D were advised.    9. Left leg numbness.  She has good peripheral pulses. The numbness could be secondary to chronic back pain producing sciatica. An MRI of the back will be considered if physical therapy does not help.  Patient has been seeing orthopedics.    Follow-up  The patient will follow up in 4 months.          Allergies   Allergen Reactions    Morphine Itching    Acetaminophen Rash    Oxycodone Rash    Oxycodone-Acetaminophen Itching and Nausea And Vomiting     Prior to Visit Medications    Medication Sig Taking? Authorizing Provider   cyanocobalamin 1000 MCG/ML injection Inject 1 mL into the muscle every  30 days Yes Don Perking, MD   rosuvastatin (CRESTOR) 5 MG tablet Take 1 tablet by mouth daily Yes Zamia Tyminski, MD   lisinopril-hydroCHLOROthiazide (PRINZIDE;ZESTORETIC) 10-12.5 MG per tablet Take 1 tablet by mouth daily Yes Skyelyn Scruggs, MD   amLODIPine (NORVASC) 5 MG tablet Take 1 tablet by mouth in the morning and at bedtime Yes Don Perking, MD       CareTeam (Including outside providers/suppliers regularly involved in providing care):   Patient Care Team:  Don Perking, MD as PCP - General  Don Perking, MD as PCP - Empaneled Provider  [place], Maurice Small, MD as Physician (Endocrinology)      Reviewed and updated this visit:  Tobacco  Allergies  Meds  Problems   Med Hx  Surg Hx  Soc Hx  Fam Hx

## 2022-09-26 NOTE — Progress Notes (Signed)
"  Have you been to the ER, urgent care clinic since your last visit?  Hospitalized since your last visit?"    NO    "Have you seen or consulted any other health care providers outside of [facility] since your last visit?"    NO    Have you had a mammogram?"   NO    Date of last Mammogram: 09/11/2021             Click Here for Release of Records Request

## 2022-10-12 ENCOUNTER — Encounter

## 2022-10-16 ENCOUNTER — Inpatient Hospital Stay: Admit: 2022-10-16 | Payer: MEDICARE | Attending: Internal Medicine | Primary: Internal Medicine

## 2022-10-16 DIAGNOSIS — Z1231 Encounter for screening mammogram for malignant neoplasm of breast: Secondary | ICD-10-CM

## 2022-10-19 ENCOUNTER — Inpatient Hospital Stay: Admit: 2022-10-19 | Payer: MEDICARE | Primary: Internal Medicine

## 2022-10-19 DIAGNOSIS — M5416 Radiculopathy, lumbar region: Secondary | ICD-10-CM

## 2022-10-19 NOTE — Telephone Encounter (Signed)
 Patient called in stating that her B12 medication was suppose to be sen to her pharmacy and the pharmacy stated they are needed prior auth and to call her PCP [facility]

## 2022-10-23 ENCOUNTER — Encounter: Payer: MEDICARE | Primary: Internal Medicine

## 2022-10-24 NOTE — Telephone Encounter (Signed)
 Tried to contact patient and her phone is disconnected [employer]

## 2022-11-08 MED ORDER — AMLODIPINE BESYLATE 5 MG PO TABS
5 | ORAL_TABLET | Freq: Two times a day (BID) | ORAL | 3 refills | Status: DC
Start: 2022-11-08 — End: 2022-11-21

## 2022-11-21 MED ORDER — AMLODIPINE BESYLATE 5 MG PO TABS
5 | ORAL_TABLET | Freq: Two times a day (BID) | ORAL | 1 refills | Status: DC
Start: 2022-11-21 — End: 2023-03-21

## 2023-01-21 ENCOUNTER — Inpatient Hospital Stay: Admit: 2023-01-21 | Payer: MEDICARE | Primary: Internal Medicine

## 2023-01-23 LAB — COMPREHENSIVE METABOLIC PANEL
ALT: 9 IU/L (ref 0–32)
AST: 17 IU/L (ref 0–40)
Albumin: 4.3 g/dL (ref 3.9–4.9)
Alkaline Phosphatase: 96 IU/L (ref 44–121)
BUN/Creatinine Ratio: 21 (ref 12–28)
BUN: 16 mg/dL (ref 8–27)
CO2: 24 mmol/L (ref 20–29)
Calcium: 9.4 mg/dL (ref 8.7–10.3)
Chloride: 105 mmol/L (ref 96–106)
Creatinine: 0.77 mg/dL (ref 0.57–1.00)
Est, Glom Filt Rate: 83 mL/min/{1.73_m2} (ref >59)
Globulin, Total: 3.1 g/dL (ref 1.5–4.5)
Glucose: 88 mg/dL (ref 70–99)
Potassium: 4.4 mmol/L (ref 3.5–5.2)
Sodium: 142 mmol/L (ref 134–144)
Total Bilirubin: 0.2 mg/dL (ref 0.0–1.2)
Total Protein: 7.4 g/dL (ref 6.0–8.5)

## 2023-01-23 LAB — CBC WITH AUTO DIFFERENTIAL
Basophils %: 0 %
Basophils Absolute: 0 10*3/uL (ref 0.0–0.2)
Eosinophils %: 3 %
Eosinophils Absolute: 0.2 10*3/uL (ref 0.0–0.4)
Hematocrit: 36.8 % (ref 34.0–46.6)
Hemoglobin: 12.1 g/dL (ref 11.1–15.9)
Immature Grans (Abs): 0 10*3/uL (ref 0.0–0.1)
Immature Granulocytes %: 0 %
Lymphocytes %: 47 %
Lymphocytes Absolute: 2.6 10*3/uL (ref 0.7–3.1)
MCH: 30.7 pg (ref 26.6–33.0)
MCHC: 32.9 g/dL (ref 31.5–35.7)
MCV: 93 fL (ref 79–97)
Monocytes %: 7 %
Monocytes Absolute: 0.4 10*3/uL (ref 0.1–0.9)
Neutrophils %: 43 %
Neutrophils Absolute: 2.4 10*3/uL (ref 1.4–7.0)
Platelets: 270 10*3/uL (ref 150–450)
RBC: 3.94 x10E6/uL (ref 3.77–5.28)
RDW: 13.1 % (ref 11.7–15.4)
WBC: 5.6 10*3/uL (ref 3.4–10.8)

## 2023-01-23 LAB — LIPID PANEL
Cholesterol, Total: 202 mg/dL — ABNORMAL HIGH (ref 100–199)
HDL: 68 mg/dL (ref >39)
LDL Cholesterol: 115 mg/dL — ABNORMAL HIGH (ref 0–99)
Triglycerides: 105 mg/dL (ref 0–149)
VLDL Cholesterol Calculated: 19 mg/dL (ref 5–40)

## 2023-01-23 LAB — HEMOGLOBIN A1C: Hemoglobin A1C: 6.1 % — ABNORMAL HIGH (ref 4.8–5.6)

## 2023-01-23 LAB — SPECIMEN STATUS REPORT

## 2023-01-23 LAB — THYROID CASCADE PROFILE: TSH: 0.65 u[IU]/mL (ref 0.450–4.500)

## 2023-01-26 ENCOUNTER — Encounter: Admit: 2023-01-26 | Admitting: Internal Medicine

## 2023-01-26 DIAGNOSIS — I10 Essential (primary) hypertension: Secondary | ICD-10-CM

## 2023-01-29 ENCOUNTER — Encounter: Admit: 2023-01-29 | Payer: MEDICARE | Admitting: Internal Medicine | Primary: Internal Medicine

## 2023-01-29 VITALS — BP 121/81 | HR 67 | Temp 98.10000°F | Resp 16 | Ht 61.2 in | Wt 166.8 lb

## 2023-01-29 DIAGNOSIS — I10 Essential (primary) hypertension: Secondary | ICD-10-CM

## 2023-01-29 MED ORDER — ROSUVASTATIN CALCIUM 5 MG PO TABS
5 MG | ORAL_TABLET | Freq: Every day | ORAL | 1 refills | Status: DC
Start: 2023-01-29 — End: 2023-08-12

## 2023-01-29 MED ORDER — CYANOCOBALAMIN 1000 MCG/ML IJ SOLN
1000 | INTRAMUSCULAR | 3 refills | Status: DC
Start: 2023-01-29 — End: 2023-05-21

## 2023-01-29 NOTE — Progress Notes (Signed)
 [facility]  [address]  [place], [place]  [address]  Phone: [phone]      Tracy Jordan (DOB: [birth year]) is a 72 y.o. female, established patient, here for evaluation of the following chief complaint(s):  Follow-up         SUBJECTIVE/OBJECTIVE:  History of Present Illness  The patient is a 72 year old female with a past medical history of hypertension, B12 deficiency with a B12 level of 126, prediabetes, hypercholesterolemia, right upper lobe nodule, osteoporosis, and nicotine dependence, here for a 25-month follow-up.    She continues to experience a sensation akin to wearing socks, which she attributes to her B12 deficiency. Despite not receiving her prescribed B12 injections, she has self-administered oral B12 supplements. She has previously received 3 B12 injections.    She acknowledges her ongoing smoking habit and expresses a desire to quit, contemplating the use of gum as a potential aid.    She reports intermittent back pain, which she manages with over-the-counter analgesics such as Tylenol. The pain is less severe than previous episodes and is exacerbated by bending movements.    She has sufficient supply of her antihypertensive medications, amlodipine  and lisinopril .    She has requested a refill of her cholesterol medication.    She is due for an ophthalmological examination due to unspecified eye issues.    She has declined the influenza vaccine.    SOCIAL HISTORY  The patient admits to smoking. She is currently employed part-time in a school [employer].    MEDICATIONS  Current: Amlodipine , lisinopril , B12 supplements, Tylenol.  Discontinued: Rosuvastatin .    IMMUNIZATIONS  She declined the influenza vaccine.     Patient had blood work done on 01/21/2023 potassium 4.4.  BUN/creatinine normal.  Calcium  9.4.  Total cholesterol 292.  HDL 68.  LDL 115.  LFTs within normal limit.  A1c 6.1 same as before.  TSH 0.65.  White count is 5.6.  Hemoglobin 12.1.   Platelet count 270.    Hemoglobin A1C   Date Value Ref Range Status   01/21/2023 6.1 (H) 4.8 - 5.6 % Final     Comment:                 Prediabetes: 5.7 - 6.4           Diabetes: >6.4           Glycemic control for adults with diabetes: <7.0        Hemoglobin   Date Value Ref Range Status   01/21/2023 12.1 11.1 - 15.9 g/dL Final     Hematocrit   Date Value Ref Range Status   01/21/2023 36.8 34.0 - 46.6 % Final     Creatinine   Date Value Ref Range Status   01/21/2023 0.77 0.57 - 1.00 mg/dL Final     Glucose   Date Value Ref Range Status   01/21/2023 88 70 - 99 mg/dL Final     TSH   Date Value Ref Range Status   01/21/2023 0.650 0.450 - 4.500 uIU/mL Final     Comment:     No apparent thyroid disorder. Additional testing not indicated. In  rare instances, Secondary Hypothyroidism as well as Subclinical  Hypothyroidism have been reported in some patients with normal TSH  values.       Potassium   Date Value Ref Range Status   01/21/2023 4.4 3.5 - 5.2 mmol/L Final     Cholesterol, Total   Date Value Ref Range  Status   01/21/2023 202 (H) 100 - 199 mg/dL Final     HDL   Date Value Ref Range Status   01/21/2023 68 >39 mg/dL Final     Triglycerides   Date Value Ref Range Status   01/21/2023 105 0 - 149 mg/dL Final     Lab Results   Component Value Date    LDL 115 (H) 01/21/2023     MRI Result (most recent):  MRI LUMBAR SPINE WO CONTRAST 10/19/2022    Narrative  MRI OF THE LUMBAR SPINE WITHOUT CONTRAST: 10/19/2022 3:41 PM    INDICATION:  Radiculopathy, lumbar region    TECHNIQUE: Multiplanar and multisequence MRI was obtained of the lumbar spine  without contrast.    COMPARISON: None.    FINDINGS:  Lumbar alignment is normal. There is mild fatty degenerative endplate change  L5-S1. Vertebral body heights are preserved. The visualized cord is normal in  caliber and signal intensity. The conus medullaris terminates at the L1-L2  level. The upper abdominal aorta is ectatic (29 mm) not frankly aneurysmal.    T12-L1: No herniation  or stenosis.  L1-L2: No herniation or stenosis.  L2-L3: Facet osteoarthritis causes mild canal stenosis.  L3-L4: Facet osteoarthritis causes mild canal and mild bilateral neural  foraminal stenosis.  L4-L5: Facet osteoarthritis and a mild disc bulge cause mild canal and mild  bilateral neural foraminal stenosis.  L5-S1: Facet osteoarthritis and loss of disc height cause bilateral neural  foraminal stenosis. Minimal disc bulge.    Impression  1. Bilateral neural foraminal stenosis L5-S1.  2. More mild stenoses as described above.      Electronically signed by Wyvonna Heidelberg    CT Result (most recent):  CT LUNG SCREENING 07/21/2022    Narrative  EXAM:  CT CHEST WITHOUT CONTRAST    INDICATION: Personal history of nicotine dependence.    COMPARISON: 06/08/2021.    CONTRAST: None.    TECHNIQUE: Low dose unenhanced multislice helical CT was performed from the  thoracic inlet to the adrenal glands without intravenous contrast  administration. Contiguous 1.25 mm axial images were reconstructed and lung and  soft tissue windows were generated. Coronal and sagittal reformations and axial  MIP images were generated.  CT dose reduction was achieved through use of a  standardized protocol tailored for this examination and automatic exposure  control for dose modulation. Lack of intravenous contrast limits evaluation of  the vasculature, mediastinum, hila, and solid organs.    DOSE: CTDIvol 2.28 mGy    FINDINGS:  PLEURA: No effusion or pneumothorax.  LUNGS: The 1 cm groundglass nodule in the right upper lobe is unchanged compared  to the prior exam. 2 mm pulmonary nodule right upper lobe is stable (image 50).  No suspicious pulmonary nodule.    CHEST WALL: No mass or axillary lymphadenopathy.  THYROID: No nodule.  MEDIASTINUM: No mass or lymphadenopathy.  HILA: No mass or lymphadenopathy.  THORACIC AORTA: No aneurysm.  MAIN PULMONARY ARTERY: Normal in caliber.  TRACHEA/BRONCHI: Patent.  ESOPHAGUS: No wall thickening or  dilatation.  HEART: Normal in size. Coronary artery calcium : present    UPPER ABDOMEN: The incidentally imaged upper abdomen is unremarkable.Aaron Aas  BONES: No evidence of aggressive bone lesion..    Impression  Stable groundglass nodule right upper lobe. No suspicious pulmonary nodule.    Lung-RADS Category: 2  Management recommendation: Annual screening with low-dose CT    [email or web address]      Current Outpatient Medications   Medication Sig  cyanocobalamin  1000 MCG/ML injection Inject 1 mL into the muscle every 30 days    rosuvastatin  (CRESTOR ) 5 MG tablet Take 1 tablet by mouth daily    amLODIPine  (NORVASC ) 5 MG tablet TAKE 1 TABLET BY MOUTH IN THE MORNING AND AT BEDTIME    lisinopril -hydroCHLOROthiazide  (PRINZIDE ;ZESTORETIC ) 10-12.5 MG per tablet Take 1 tablet by mouth daily     No current facility-administered medications for this visit.       Allergies   Allergen Reactions    Morphine Itching    Acetaminophen Rash    Oxycodone Rash    Oxycodone-Acetaminophen Itching and Nausea And Vomiting        Past Medical History:   Diagnosis Date    Arthritis     Chronic back pain greater than 3 months duration     Chronic pain     Decreased thyroid stimulating hormone level     Hepatitis C     Hypercholesterolemia     Hypertension     Osteoporosis     Vitamin D  deficiency         Family History   Problem Relation Age of Onset    Breast Cancer Maternal Aunt 89    Colon Cancer Sister 14    Hypertension Brother     Hypertension Maternal Aunt     Hypertension Mother     Asthma Mother     Breast Cancer Mother 46    Hypertension Sister         Past Surgical History:   Procedure Laterality Date    CATARACT REMOVAL Bilateral     CESAREAN SECTION      CYST REMOVAL      Cyst removed ofrom upper chest area    HYSTERECTOMY (CERVIX STATUS UNKNOWN)       h/o abdominal hysterectomy for uterine fibroids and pelvic pain at age 56    ROTATOR CUFF REPAIR Left        Social History     Tobacco Use    Smoking  status: Every Day     Current packs/day: 0.25     Average packs/day: 0.3 packs/day for 52.9 years (13.2 ttl pk-yrs)     Types: Cigarettes     Start date: 4     Passive exposure: Current    Smokeless tobacco: Never   Vaping Use    Vaping status: Never Used   Substance Use Topics    Alcohol use: Yes     Alcohol/week: 1.0 standard drink of alcohol     Types: 1 Glasses of wine per week     Comment: occ    Drug use: Never         Review of Systems   Constitutional:  Negative for appetite change and fatigue.   Eyes:  Negative for visual disturbance.   Respiratory:  Negative for cough and shortness of breath.    Cardiovascular:  Negative for chest pain and leg swelling.   Gastrointestinal:  Negative for abdominal pain, diarrhea, nausea and vomiting.   Genitourinary:  Negative for dysuria.   Musculoskeletal:  Positive for back pain.   Skin:  Negative for rash.   Neurological:  Negative for tremors and headaches.   Psychiatric/Behavioral:  The patient is not nervous/anxious.        BP 121/81 (Site: Right Upper Arm, Position: Sitting, Cuff Size: Large Adult)   Pulse 67   Temp 98.1 F (36.7 C)   Resp 16   Ht 1.554 m (5' 1.2")   Wt  75.7 kg (166 lb 12.8 oz)   SpO2 100%   BMI 31.31 kg/m      Physical Exam   Vitals and nursing note reviewed.      Gen:  Not  in no acute distress. Well built and nourished.  HEENT:  Pink conjunctivae, PERLA, EOMI, hearing intact Mouth: Moist mucous membranes. No TPC  Neck:  Supple, without masses, thyroid not enlarged  Resp:  No accessory muscle use, clear breath sounds without wheezes rales or rhonchi  Card:  No murmurs, normal S1, S2 without thrills, bruits or peripheral edema.  Peripheral pulses felt bilaterally equal ,no calf muscle tenderness  Abd:  Soft, non-tender, non-distended, normoactive bowel sounds are present, no palpable organomegaly and no detectable hernias  Lymph:  No cervical or inguinal adenopathy  Musc:  No cyanosis or clubbing. -ve tenderness lumbar spine.  SLR  negative.  Gait: Normal  Skin:  No rashes or ulcers, skin turgor is good  Neuro: Alert and oriented x 3. Cranial nerves are grossly intact, no focal motor weakness, follows commands appropriately  Psych:  Good insight, mood normal.   ASSESSMENT/PLAN:  Below is the assessment and plan developed based on review of pertinent history, physical exam, labs, studies, and medications.    Assessment & Plan  1. Hypertension.  Her blood pressure today is 121/81, indicating good control. She will continue her current regimen of amlodipine  and lisinopril .    2. Hypercholesterolemia.  Her total cholesterol remains elevated at 202, with an LDL level of 115. It is unclear if she has been adhering to her rosuvastatin  therapy. She will restart rosuvastatin .    3. Prediabetes.  Her A1c level is 6.1. She is advised to reduce her intake of sweets and carbohydrates.    4. Numbness.  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 Her B12 level was notably low at 126. A refill for B12 injections has been sent to the pharmacy. If the pharmacy does not administer the injection, she can bring it to the office for administration.    5. Tobacco dependence.  She is strongly advised to cease smoking.    6. Spinal stenosis.  She is managing well. She is advised to engage in back stretching exercises.    7. Health maintenance.  She had lung cancer screening in June. DEXA scan was performed in April. She is due for an ophthalmological examination due to unspecified eye issues. She has declined the influenza vaccine.    Follow-up  The patient will follow up in 4 months.       Orders Placed This Encounter    Comprehensive Metabolic Panel     Standing Status:   Future     Standing Expiration Date:   01/29/2024    Hemoglobin A1C     Standing Status:   Future     Standing Expiration Date:   01/29/2024    Urinalysis with Reflex to Culture     Standing Status:   Future     Standing Expiration  Date:   01/29/2024     Order Specific Question:   SPECIFY(EX-CATH,MIDSTREAM,CYSTO,ETC)?     Answer:   mid stream    Lipid Panel     Standing Status:   Future     Standing Expiration Date:   01/29/2024    cyanocobalamin  1000 MCG/ML injection     Sig: Inject 1 mL into the muscle every 30 days     Dispense:  3  mL     Refill:  3    rosuvastatin  (CRESTOR ) 5 MG tablet     Sig: Take 1 tablet by mouth daily     Dispense:  90 tablet     Refill:  1        No follow-ups on file.     There are no Patient Instructions on file for this visit.     There are no preventive care reminders to display for this patient.     The patient (or guardian, if applicable) and other individuals in attendance with the patient were advised that Artificial Intelligence will be utilized during this visit to record and process the conversation to generate a clinical note. The patient (or guardian, if applicable) and other individuals in attendance at the appointment consented to the use of AI, including the recording.                 Aspects of this note may have been generated using voice recognition software. Despite editing, there may be unrecognized errors.    An electronic signature was used to authenticate this note.  -- Basilio Limb, MD

## 2023-01-29 NOTE — Progress Notes (Signed)
 Identified pt with two pt identifiers(name and DOB). Reviewed record in preparation for visit and have obtained necessary documentation. All patient medications has been reviewed.  Chief Complaint   Patient presents with    Follow-up       Vitals:    1

## 2023-02-19 ENCOUNTER — Encounter: Admit: 2023-02-19 | Discharge: 2023-02-19 | Payer: Medicare (Managed Care) | Primary: Internal Medicine

## 2023-02-19 DIAGNOSIS — E538 Deficiency of other specified B group vitamins: Principal | ICD-10-CM

## 2023-02-19 MED ORDER — LISINOPRIL-HYDROCHLOROTHIAZIDE 10-12.5 MG PO TABS
10-12.5 MG | ORAL_TABLET | Freq: Every day | ORAL | 1 refills | Status: DC
Start: 2023-02-19 — End: 2023-08-12

## 2023-02-19 NOTE — Progress Notes (Signed)
 Patient came into clinic for a B12 injection   Patient tolerated well  Placed in Right upper arm      Lot: [id number]  EXP: 06/2024  NDC: 5409-8119-14

## 2023-03-21 ENCOUNTER — Encounter: Admit: 2023-03-21 | Discharge: 2023-03-21 | Payer: MEDICARE | Primary: Internal Medicine

## 2023-03-21 DIAGNOSIS — E538 Deficiency of other specified B group vitamins: Secondary | ICD-10-CM

## 2023-03-21 MED ORDER — CYANOCOBALAMIN 1000 MCG/ML IJ SOLN
1000 | Freq: Once | INTRAMUSCULAR | Status: AC
Start: 2023-03-21 — End: 2023-03-21
  Administered 2023-03-21: 20:00:00 1000 ug via INTRAMUSCULAR

## 2023-03-21 NOTE — Progress Notes (Signed)
Chief Complaint   Patient presents with    Injections     B12 injections     Patient came into clinic for a B 12 injections  Patient tolerated well  Patient supplied.

## 2023-03-22 MED ORDER — AMLODIPINE BESYLATE 5 MG PO TABS
5 MG | ORAL_TABLET | Freq: Two times a day (BID) | ORAL | 1 refills | Status: DC
Start: 2023-03-22 — End: 2023-08-12

## 2023-04-19 ENCOUNTER — Encounter: Payer: MEDICARE | Primary: Internal Medicine

## 2023-04-23 ENCOUNTER — Encounter: Payer: MEDICARE | Primary: Internal Medicine

## 2023-04-24 ENCOUNTER — Encounter: Admit: 2023-04-24 | Discharge: 2023-04-24 | Payer: MEDICARE | Primary: Internal Medicine

## 2023-04-24 DIAGNOSIS — E538 Deficiency of other specified B group vitamins: Secondary | ICD-10-CM

## 2023-04-24 MED ORDER — CYANOCOBALAMIN 1000 MCG/ML IJ SOLN
1000 | Freq: Once | INTRAMUSCULAR | Status: AC
Start: 2023-04-24 — End: 2023-04-24
  Administered 2023-04-24: 20:00:00 1000 ug via INTRAMUSCULAR

## 2023-04-24 NOTE — Progress Notes (Signed)
 Patient came into clinic for a b12 injections  Patient tolerated well   Injected in to Right deltoid    Patient supplied.

## 2023-05-21 ENCOUNTER — Encounter: Payer: MEDICARE | Primary: Internal Medicine

## 2023-05-21 ENCOUNTER — Telehealth

## 2023-05-21 MED ORDER — CYANOCOBALAMIN 1000 MCG/ML IJ SOLN
1000 MCG/ML | INTRAMUSCULAR | 3 refills | Status: DC
Start: 2023-05-21 — End: 2023-05-30

## 2023-05-21 NOTE — Telephone Encounter (Signed)
 Patient needs a refill on:     cyanocobalamin 1000 MCG/ML injection

## 2023-05-28 ENCOUNTER — Inpatient Hospital Stay: Admit: 2023-05-28 | Payer: MEDICARE | Primary: Internal Medicine

## 2023-05-28 ENCOUNTER — Ambulatory Visit: Admit: 2023-05-28 | Discharge: 2023-05-28 | Payer: MEDICARE | Primary: Internal Medicine

## 2023-05-28 DIAGNOSIS — E538 Deficiency of other specified B group vitamins: Secondary | ICD-10-CM

## 2023-05-28 DIAGNOSIS — I10 Essential (primary) hypertension: Secondary | ICD-10-CM

## 2023-05-28 LAB — COMPREHENSIVE METABOLIC PANEL
ALT: 15 U/L (ref 12–78)
AST: 14 U/L — ABNORMAL LOW (ref 15–37)
Albumin/Globulin Ratio: 1.1 (ref 1.1–2.2)
Albumin: 4.3 g/dL (ref 3.5–5.0)
Alk Phosphatase: 105 U/L (ref 45–117)
Anion Gap: 1 mmol/L — ABNORMAL LOW (ref 2–12)
BUN/Creatinine Ratio: 14 (ref 12–20)
BUN: 12 mg/dL (ref 6–20)
CO2: 31 mmol/L (ref 21–32)
Calcium: 9.4 mg/dL (ref 8.5–10.1)
Chloride: 106 mmol/L (ref 97–108)
Creatinine: 0.84 mg/dL (ref 0.55–1.02)
Est, Glom Filt Rate: 75 mL/min/{1.73_m2} (ref >60)
Globulin: 3.8 g/dL (ref 2.0–4.0)
Glucose: 97 mg/dL (ref 65–100)
Potassium: 4.1 mmol/L (ref 3.5–5.1)
Sodium: 138 mmol/L (ref 136–145)
Total Bilirubin: 0.3 mg/dL (ref 0.2–1.0)
Total Protein: 8.1 g/dL (ref 6.4–8.2)

## 2023-05-28 MED ORDER — CYANOCOBALAMIN 1000 MCG/ML IJ KIT
1000 MCG/ML | INTRAMUSCULAR | Status: DC
Start: 2023-05-28 — End: 2023-06-03

## 2023-05-28 NOTE — Progress Notes (Signed)
 Patient is here for injection.After obtaining consent, and per orders of Dr.Puthumana. injection of B12 given by Greer Ee, LPN as follows.    Dose amout : 1 ml  Injection site :Left Deltoid  Route :  IM        Lot [id number]  EXP 12/19/2024  NDC# 5284-1324-40      Patient tolerated injection well.

## 2023-05-29 LAB — LIPID PANEL
Chol/HDL Ratio: 2.5 (ref 0.0–5.0)
Cholesterol, Total: 164 mg/dL (ref <200)
HDL: 65 mg/dL
LDL Cholesterol: 79 mg/dL (ref 0–100)
Triglycerides: 100 mg/dL (ref <150)
VLDL Cholesterol Calculated: 20 mg/dL

## 2023-05-29 LAB — HEMOGLOBIN A1C
Estimated Avg Glucose: 126 mg/dL
Hemoglobin A1C: 6 % — ABNORMAL HIGH (ref 4.0–5.6)

## 2023-05-30 ENCOUNTER — Encounter

## 2023-05-30 ENCOUNTER — Ambulatory Visit: Admit: 2023-05-30 | Discharge: 2023-05-30 | Payer: MEDICARE | Attending: Internal Medicine | Primary: Internal Medicine

## 2023-05-30 VITALS — BP 131/87 | HR 66 | Temp 97.80000°F | Resp 16 | Ht 61.5 in | Wt 168.0 lb

## 2023-05-30 DIAGNOSIS — Z0000 Encounter for general adult medical examination without abnormal findings: Secondary | ICD-10-CM

## 2023-05-30 NOTE — Progress Notes (Signed)
 Medicare Annual Wellness Visit    Tracy Jordan is here for Medicare AWV    Assessment & Plan  1. Medicare wellness visit.  She was counseled on the importance of maintaining a healthy diet and regular exercise regimen. The necessity of age-appropriate immunizations was discussed, but she declined any vaccinations at this time. An advanced directive was also discussed. She was advised to practice portion control in her diet and increase her physical activity. Her mammogram is up-to-date. A bone density scan has been ordered.  Also encouraged to stop smoking    2. Hypertension.  Her blood pressure is well-managed. She will continue with her current medication regimen.    3. B12 deficiency.  She will continue to receive her B12 injections.    4. Prediabetes.  Her A1c level has decreased from 6.1 to 6. She will continue with a low carbohydrate diet.    5. Hypercholesterolemia.  Her total cholesterol level has decreased to 164. She will continue with her current management plan.    6. 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  An MRI revealed spinal stenosis at L5-S1. 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 An EMG study will be conducted to further investigate the cause of her symptoms.    7. Right upper lobe nodule.  She has a history of tobacco smoking. A repeat CT scan is scheduled for 06/2023.    Follow-up  The patient is scheduled for a follow-up visit in 4 months.    Medicare annual wellness visit, subsequent  Advance directive discussed with patient  Post-menopausal  Comments:  To get DEXA scan  Orders:  -     DEXA BONE DENSITY AXIAL SKELETON; Future  Essential hypertension, benign  -     Comprehensive Metabolic Panel; Future  Vitamin B 12 deficiency  -     CBC with Auto Differential; Future  Spinal stenosis of lumbar region without neurogenic claudication  Prediabetes  -     Hemoglobin A1C; Future  Peripheral neuropathic pain  -     [facility]  [place]  Personal history of tobacco use  -     PR VISIT TO DISCUSS LUNG CA SCREEN W LDCT  -     CT Lung Screen (Initial/Annual/Baseline); Future  Hypercholesterolemia  -     Lipid Panel; Future  Right upper lobe pulmonary nodule  Results  Labs  Lab review on/8/25 potassium 4.1.  BUN/creatinine normal.  Blood sugar 97.  Calcium 9.4.  Total cholesterol 164 decreased to from 202.  HDL 65.  LDL 79.  LFTs within normal limit.  A1c 6 decreased from 6.1.   - B12 level: 126       Return in about 4 months (around 09/29/2023).     Subjective     History of Present Illness  The patient is a 73 year old female with a past medical history of hypertension, B12 deficiency, prediabetes, hypercholesterolemia, right upper lobe nodule, osteoporosis, and tobacco dependence, presenting for a Medicare wellness visit and a 71-month follow-up.    She reports experiencing numbness in her legs, which she describes as a sensation similar to wearing socks. Additionally, significant numbness in her thigh is noted. The presence of knots on her feet is also mentioned.    She continues to smoke, with a pack lasting her approximately 3 days. Smoking has been a habit for many years.    She is not currently sexually active and has been abstinent for the past 4 years. Her  niece is designated as her decision-maker in the event that she becomes incapable of making decisions for herself.    Dissatisfaction with her previous ophthalmology visit is expressed, stating that her eye condition remains poor. Plans to schedule another appointment are mentioned.    A CT scan of her lungs was performed in 06/2022.    A bone density scan has not been done recently.    Intermittent back pain is experienced, which she attributes to her spine. Monthly injections for pain management are continued.    Further vaccinations have been declined.    A previous diagnosis of hepatitis is mentioned, but it is reported to be no longer present.    Current medication for  cholesterol management is being taken.    SOCIAL HISTORY  She admits to smoking a pack lasting about 3 days.      Patient's complete Health Risk Assessment and screening values have been reviewed and are found in Flowsheets. The following problems were reviewed today and where indicated follow up appointments were made and/or referrals ordered.    Positive Risk Factor Screenings with Interventions:               Poor Eating Habits/Diet:  Do you eat balanced/healthy meals regularly?: (!) (Patient-Rptd) No  Interventions:  Advised patient to eat balanced meals and avoid sodas    Abnormal BMI (obese):  Body mass index is 31.23 kg/m. (!) Abnormal  Interventions:  Portion control and increasing exercise discussed          Vision Screen:  Do you have difficulty driving, watching TV, or doing any of your daily activities because of your eyesight?: (!) (Patient-Rptd) Yes  Have you had an eye exam within the past year?: (Patient-Rptd) Yes  Interventions:   Patient encouraged to make appointment with their eye specialist      Advanced Directives:  Do you have a Living Will?: (!) (Patient-Rptd) No    Intervention:  has NO advanced directive - information provided        Tobacco Use:    Tobacco Use      Smoking status: Every Day        Packs/day: 0.25        Years: 0.3 packs/day for 53.3 years (13.3 ttl pk-yrs)        Types: Cigarettes        Start date: 51        Passive exposure: Current      Smokeless tobacco: Never     Interventions:  Patient encouraged to stop smoking.  Will do lung cancer screening 5/25        Past Medical History:   Diagnosis Date    Arthritis     Chronic back pain greater than 3 months duration     Chronic pain     Decreased thyroid stimulating hormone level     Hepatitis C     Hypercholesterolemia     Hypertension     Osteoporosis     Vitamin D deficiency       Constitutional:  Negative for chills and fever.   HENT:  Negative for congestion, ear pain, nosebleeds, sinus pain, sore throat and  tinnitus.    Eyes:  Negative for redness.   Respiratory:  Negative for cough and shortness of breath.    Cardiovascular:  Negative for chest pain and palpitations.   Gastrointestinal:  Negative for abdominal pain, diarrhea, nausea and vomiting.   Endocrine: Negative for cold intolerance and polyuria.  Genitourinary:  Negative for dysuria and hematuria.   Musculoskeletal:  Negative for back pain and neck pain.   Skin:  Negative for rash.   Neurological:  Negative for dizziness and headaches.  Positive glove and  stocking numbness on both lower legs worse on right  Psychiatric/Behavioral: Negative.               Objective   Vitals:    05/30/23 1607   BP: 131/87   BP Site: Right Upper Arm   Patient Position: Sitting   BP Cuff Size: Large Adult   Pulse: 66   Resp: 16   Temp: 97.8 F (36.6 C)   TempSrc: Oral   SpO2: 98%   Weight: 76.2 kg (168 lb)   Height: 1.562 m (5' 1.5")      Body mass index is 31.23 kg/m.        Physical Exam  Vitals and nursing note reviewed.    Gen:  Not  in no acute distress. Well built and nourished.  HEENT:  Pink conjunctivae, PERLA, EOMI, hearing intact Mouth: Moist mucous membranes. No TPC  Neck:  Supple, without masses, thyroid not enlarged  Resp:  No accessory muscle use, clear breath sounds without wheezes rales or rhonchi  Card:  No murmurs, normal S1, S2 without thrills, bruits or peripheral edema.  Peripheral pulses felt bilaterally equal ,no calf muscle tenderness  Abd:  Soft, non-tender, non-distended, normoactive bowel sounds are present, no palpable organomegaly and no detectable hernias  Lymph:  No cervical or inguinal adenopathy  Musc:  No cyanosis or clubbing. -ve tenderness lumbar spine.  SLR negative.  Gait: Normal  Skin:  No rashes or ulcers, skin turgor is good  Neuro: Alert and oriented x 3. Cranial nerves are grossly intact, no focal motor weakness, follows commands appropriately.  Sensation intact  Psych:  Good insight, mood normal.              Allergies   Allergen  Reactions    Morphine Itching    Acetaminophen Rash    Oxycodone Rash    Oxycodone-Acetaminophen Itching and Nausea And Vomiting     Prior to Visit Medications    Medication Sig Taking? Authorizing Provider   amLODIPine (NORVASC) 5 MG tablet Take 1 tablet by mouth in the morning and at bedtime  Bert Ptacek, MD   lisinopril-hydroCHLOROthiazide (PRINZIDE;ZESTORETIC) 10-12.5 MG per tablet TAKE 1 TABLET BY MOUTH ONCE DAILY  Damiel Barthold, MD   rosuvastatin (CRESTOR) 5 MG tablet Take 1 tablet by mouth daily  Yarisbel Miranda, MD       CareTeam (Including outside providers/suppliers regularly involved in providing care):   Patient Care Team:  Daizy Outen, MD as PCP - General  Farrah Skoda, MD as PCP - Empaneled Provider  [place], Tamala Fair, MD as Physician (Endocrinology)     Recommendations for Preventive Services Due: see orders and patient instructions/AVS.  Recommended screening schedule for the next 5-10 years is provided to the patient in written form: see Patient Instructions/AVS.     Reviewed and updated this visit:  Tobacco  Allergies  Meds  Problems  Med Hx  Surg Hx  Fam Hx  Sexual   Hx                  The patient (or guardian, if applicable) and other individuals in attendance with the patient were advised that Artificial Intelligence will be utilized during this visit to record and process the conversation to generate a clinical  note. The patient (or guardian, if applicable) and other individuals in attendance at the appointment consented to the use of AI, including the recording.    Discussed with the patient the current USPSTF guidelines released April 28, 2019 for screening for lung cancer.    For adults aged 32 to 41 years who have a 20 pack-year smoking history and currently smoke or have quit within the past 15 years the grade B recommendation is to:  Screen for lung cancer with low-dose computed tomography (LDCT) every year.  Stop screening once a person has not  smoked for 15 years or has a health problem that limits life expectancy or the ability to have lung surgery.    The patient  reports that she has been smoking cigarettes. She started smoking about 53 years ago. She has a 13.3 pack-year smoking history. She has been exposed to tobacco smoke. She has never used smokeless tobacco.. Discussed with patient the risks and benefits of screening, including over-diagnosis, false positive rate, and total radiation exposure.  The patient currently exhibits no signs or symptoms suggestive of lung cancer.  Discussed with patient the importance of compliance with yearly annual lung cancer screenings and willingness to undergo diagnosis and treatment if screening scan is positive.  In addition, the patient was counseled regarding the importance of remaining smoke free and/or total smoking cessation.    Also reviewed the following if the patient has Medicare that as of March 31, 2020, Medicare only covers LDCT screening in patients aged 50-77 with at least a 20 pack-year smoking history who currently smoke or have quit in the last 15 years

## 2023-05-30 NOTE — Patient Instructions (Addendum)
 Learning About Vision Tests  What are vision tests?     The four most common vision tests are visual acuity tests, refraction, visual field tests, and color vision tests.  Visual acuity (sharpness) tests  These tests are used:  To see if you need glasses or contact lenses.  To monitor an eye problem.  To check an eye injury.  Visual acuity tests are done as part of routine exams. You may also have this test when you get your driver's license or apply for some types of jobs.  Visual field tests  These tests are used:  To check for vision loss in any area of your range of vision.  To screen for certain eye diseases.  To look for nerve damage after a stroke, head injury, or other problem that could reduce blood flow to the brain.  Refraction and color tests  A refraction test is done to find the right prescription for glasses and contact lenses.  A color vision test is done to check for color blindness.  Color vision is often tested as part of a routine exam. You may also have this test when you apply for a job where recognizing different colors is important, such as truck driving, Optician, dispensing, or the [employer].  How are vision tests done?  Visual acuity test   You cover one eye at a time.  You read aloud from a wall chart across the room.  You read aloud from a small card that you hold in your hand.  Refraction   You look into a special device.  The device puts lenses of different strengths in front of each eye to see how strong your glasses or contact lenses need to be.  Visual field tests   Your doctor may have you look through special machines.  Or your doctor may simply have you stare straight ahead while they move a finger into and out of your field of vision.  Color vision test   You look at pieces of printed test patterns in various colors. You say what number or symbol you see.  Your doctor may have you trace the number or symbol using a pointer.  How do these tests feel?  There is very little chance of  having a problem from this test. If dilating drops are used for a vision test, they may make the eyes sting and cause a medicine taste in the mouth.  Follow-up care is a key part of your treatment and safety. Be sure to make and go to all appointments, and call your doctor if you are having problems. It's also a good idea to know your test results and keep a list of the medicines you take.  Where can you learn more?  Go to [employer] and enter G551 to learn more about "Learning About Vision Tests."  Current as of: September 19, 2022  Content Version: 14.4   2024-2025 [place], [place].   Care instructions adapted under license by [facility]. If you have questions about a medical condition or this instruction, always ask your healthcare professional. Larene Beach, [facility], disclaims any warranty or liability for your use of this information.         Eating Healthy Foods: Care Instructions  With every meal, you can make healthy food choices. Try to eat a variety of fruits, vegetables, whole grains, lean proteins, and low-fat dairy products. This can help you get the right balance of nutrients, including vitamins and minerals. Small changes add up  over time. You can start by adding one healthy food to your meals each day.    Try to make half your plate fruits and vegetables, one-fourth whole grains, and one-fourth lean proteins. Try including dairy with your meals.   Eat more fruits and vegetables. Try to have them with most meals and snacks.   Foods for healthy eating        Fruits   These can be fresh, frozen, canned, or dried.  Try to choose whole fruit rather than fruit juice.  Eat a variety of colors.        Vegetables   These can be fresh, frozen, canned, or dried.  Beans, peas, and lentils count too.        Whole grains   Choose whole-grain breads, cereals, and noodles.  Try brown rice.        Lean proteins   These can include lean meat, poultry, fish, and eggs.  You can also have  tofu, beans, peas, lentils, nuts, and seeds.        Dairy   Try milk, yogurt, and cheese.  Choose low-fat or fat-free when you can.  If you need to, use lactose-free milk or fortified plant-based milk products, such as soy milk.        Water   Drink water when you're thirsty.  Limit sugar-sweetened drinks, including soda, fruit drinks, and sports drinks.  Where can you learn more?  Go to [employer] and enter T756 to learn more about "Eating Healthy Foods: Care Instructions."  Current as of: November 26, 2022  Content Version: 14.4   2024-2025 [place], [place].   Care instructions adapted under license by [facility]. If you have questions about a medical condition or this instruction, always ask your healthcare professional. Larene Beach, [facility], disclaims any warranty or liability for your use of this information.         Starting a Weight-Loss Plan: Care Instructions  Overview    It can be a challenge to lose weight. But your doctor can help you make a weight-loss plan that meets your needs.  You don't have to make a lot of big changes at once. A better idea might be to focus on small changes and stick with them. When those changes become habit, you can add a few more changes.  Some people find it helpful to take an exercise or nutrition class. If you have questions, ask your doctor about seeing a registered dietitian or an exercise specialist. You might also think about joining a weight-loss support group.  If you're not ready to make changes right now, try to pick a date in the future. Then make an appointment with your doctor to talk about when and how you'll get started with a plan.  Follow-up care is a key part of your treatment and safety. Be sure to make and go to all appointments, and call your doctor if you are having problems. It's also a good idea to know your test results and keep a list of the medicines you take.  How can you care for yourself as you start a  weight-loss plan?  Set realistic goals. Many people expect to lose much more weight than is likely. A weight loss of 5% to 10% of your body weight may be enough to improve your health.  Get family and friends involved to provide support. Talk to them about why you are trying to lose weight, and ask them to help. They can help  by participating in exercise and having meals with you, even if they may be eating something different.  Find what works best for you. If you do not have time or do not like to cook, a program that offers meal replacement bars or shakes may be better for you. Or if you like to prepare meals, finding a plan that includes daily menus and recipes may be best.  Ask your doctor about other health professionals who can help you achieve your weight-loss goals.  A dietitian can help you make healthy changes in your diet.  An exercise specialist or personal trainer can help you develop a safe and effective exercise program.  A counselor or psychiatrist can help you cope with issues such as depression, anxiety, or family problems that can make it hard to focus on weight loss.  Consider joining a support group for people who are trying to lose weight. Your doctor can suggest groups in your area.  Where can you learn more?  Go to [employer] and enter U357 to learn more about "Starting a Weight-Loss Plan: Care Instructions."  Current as of: June 19, 2022  Content Version: 14.4   2024-2025 [place], [place].   Care instructions adapted under license by [facility]. If you have questions about a medical condition or this instruction, always ask your healthcare professional. Larene Beach, [facility], disclaims any warranty or liability for your use of this information.         Advance Directives: Care Instructions  Overview  An advance directive is a legal way to state your wishes at the end of your life. It tells your family and your doctor what to do if you can't say what you  want.  There are two main types of advance directives. You can change them any time your wishes change.  Living will.  This form tells your family and your doctor your wishes about life support and other treatment. The form is also called a declaration.  Medical power of attorney.  This form lets you name a person to make treatment decisions for you when you can't speak for yourself. This person is called a health care agent (health care proxy, health care surrogate). The form is also called a durable power of attorney for health care.  If you do not have an advance directive, decisions about your medical care may be made by a family member, or by a doctor or a judge who doesn't know you.  It may help to think of an advance directive as a gift to the people who care for you. If you have one, they won't have to make tough decisions by themselves.  For more information, including forms for your state, see the [employer] website ([employer]cy).  Follow-up care is a key part of your treatment and safety. Be sure to make and go to all appointments, and call your doctor if you are having problems. It's also a good idea to know your test results and keep a list of the medicines you take.  What should you include in an advance directive?  Many states have a unique advance directive form. (It may ask you to address specific issues.) Or you might use a universal form that's approved by many states.  If your form doesn't tell you what to address, it may be hard to know what to include in your advance directive. Use the questions below to help you get started.  Who do you want to make decisions about your medical  care if you are not able to?  What life-support measures do you want if you have a serious illness that gets worse over time or can't be cured?  What are you most afraid of that might happen? (Maybe you're afraid of having pain, losing your independence, or being kept alive by  machines.)  Where would you prefer to die? (Your home? A hospital? A nursing home?)  Do you want to donate your organs when you die?  Do you want certain religious practices performed before you die?  When should you call for help?  Be sure to contact your doctor if you have any questions.  Where can you learn more?  Go to [employer] and enter R264 to learn more about "Advance Directives: Care Instructions."  Current as of: April 20, 2022  Content Version: 14.4   2024-2025 [place], [place].   Care instructions adapted under license by [facility]. If you have questions about a medical condition or this instruction, always ask your healthcare professional. Larene Beach, [facility], disclaims any warranty or liability for your use of this information.         A Healthy Heart: Care Instructions  Overview     Coronary artery disease, also called heart disease, occurs when a substance called plaque builds up in the vessels that supply oxygen-rich blood to your heart muscle. This can narrow the blood vessels and reduce blood flow. A heart attack happens when blood flow is completely blocked. A high-fat diet, smoking, and other factors increase the risk of heart disease.  Your doctor has found that you have a chance of having heart disease. A heart-healthy lifestyle can help keep your heart healthy and prevent heart disease. This lifestyle includes eating healthy, being active, staying at a weight that's healthy for you, and not smoking or using tobacco. It also includes taking medicines as directed, managing other health conditions, and trying to get a healthy amount of sleep.  Follow-up care is a key part of your treatment and safety. Be sure to make and go to all appointments, and call your doctor if you are having problems. It's also a good idea to know your test results and keep a list of the medicines you take.  How can you care for yourself at home?  Diet    Use less salt when you cook  and eat. This helps lower your blood pressure. Taste food before salting. Add only a little salt when you think you need it. With time, your taste buds will adjust to less salt.     Eat fewer snack items, fast foods, canned soups, and other high-salt, high-fat, processed foods.     Read food labels and try to avoid saturated and trans fats. They increase your risk of heart disease by raising cholesterol levels.     Limit the amount of solid fat--butter, margarine, and shortening--you eat. Use olive, peanut, or canola oil when you cook. Bake, broil, and steam foods instead of frying them.     Eat a variety of fruit and vegetables every day. Dark green, deep orange, red, or yellow fruits and vegetables are especially good for you. Examples include spinach, carrots, peaches, and berries.     Foods high in fiber can reduce your cholesterol and provide important vitamins and minerals. High-fiber foods include whole-grain cereals and breads, oatmeal, beans, brown rice, citrus fruits, and apples.     Eat lean proteins. Heart-healthy proteins include seafood, lean meats and poultry, eggs, beans, peas, nuts,  seeds, and soy products.     Limit drinks and foods with added sugar. These include candy, desserts, and soda pop.   Heart-healthy lifestyle    If your doctor recommends it, get more exercise. For many people, walking is a good choice. Or you may want to swim, bike, or do other activities. Bit by bit, increase the time you're active every day. Try for at least 30 minutes on most days of the week.     Try to quit or cut back on using tobacco and other nicotine products. This includes smoking and vaping. If you need help quitting, talk to your doctor about stop-smoking programs and medicines. These can increase your chances of quitting for good. Quitting is one of the most important things you can do to protect your heart. It is never too late to quit. Try to avoid secondhand smoke too.     Stay at a weight that's healthy  for you. Talk to your doctor if you need help losing weight.     Try to get 7 to 9 hours of sleep each night.     Limit alcohol to 2 drinks a day for men and 1 drink a day for women. Too much alcohol can cause health problems.     Manage other health problems such as diabetes, high blood pressure, and high cholesterol. If you think you may have a problem with alcohol or drug use, talk to your doctor.   Medicines    Take your medicines exactly as prescribed. Call your doctor if you think you are having a problem with your medicine.     If your doctor recommends aspirin, take the amount directed each day. Make sure you take aspirin and not another kind of pain reliever, such as acetaminophen (Tylenol).   When should you call for help?   Call 911 if you have symptoms of a heart attack. These may include:    Chest pain or pressure, or a strange feeling in the chest.     Sweating.     Shortness of breath.     Pain, pressure, or a strange feeling in the back, neck, jaw, or upper belly or in one or both shoulders or arms.     Lightheadedness or sudden weakness.     A fast or irregular heartbeat.   After you call 911, the operator may tell you to chew 1 adult-strength or 2 to 4 low-dose aspirin. Wait for an ambulance. Do not try to drive yourself.  Watch closely for changes in your health, and be sure to contact your doctor if you have any problems.  Where can you learn more?  Go to [employer] and enter F075 to learn more about "A Healthy Heart: Care Instructions."  Current as of: September 19, 2022  Content Version: 14.4   2024-2025 [place], [place].   Care instructions adapted under license by [facility]. If you have questions about a medical condition or this instruction, always ask your healthcare professional. Larene Beach, [facility], disclaims any warranty or liability for your use of this information.    Personalized Preventive Plan for Tracy Jordan - 05/30/2023  Medicare offers a  range of preventive health benefits. Some of the tests and screenings are paid in full while other may be subject to a deductible, co-insurance, and/or copay.  Some of these benefits include a comprehensive review of your medical history including lifestyle, illnesses that may run in your family, and various assessments and screenings as  appropriate.  After reviewing your medical record and screening and assessments performed today your provider may have ordered immunizations, labs, imaging, and/or referrals for you.  A list of these orders (if applicable) as well as your Preventive Care list are included within your After Visit Summary for your review.           Learning About Lung Cancer Screening  What is screening for lung cancer?     Lung cancer screening is a way to find some lung cancers early, before a person has any symptoms of the cancer.  Lung cancer screening may help those who have the highest risk for lung cancer--people age 34 and older who are or were heavy smokers. For most people, who aren't at increased risk, screening for lung cancer probably isn't helpful.  Screening won't prevent cancer. And it may not find all lung cancers. Lung cancer screening may lower the risk of dying from lung cancer in a small number of people.  How is it done?  Lung cancer screening is done with a low-dose CT (computed tomography) scan. A CT scan uses X-rays, or radiation, to make detailed pictures of your body. Experts recommend that screening be done in medical centers that focus on finding and treating lung cancer.  Who is screening recommended for?  Lung cancer screening is recommended for people age 78 and older who are or were heavy smokers. That means people with a smoking history of at least 20 pack years. A pack year is a way to measure how heavy a smoker you are or were.  To figure out your pack years, multiply how many packs a day on average (assuming 20 cigarettes per pack) you have smoked by how many years  you have smoked. For example:  If you smoked 1 pack a day for 20 years, that's 1 times 20. So you have a smoking history of 20 pack years.  If you smoked 2 packs a day for 10 years, that's 2 times 10. So you have a smoking history of 20 pack years.  Experts agree that screening is for people who have a high risk of lung cancer. But experts don't agree on what high risk means. Some say people age 27 or older with at least a 20-pack-year smoking history are high risk. Others say it's people age 23 or older with a 30-pack-year history.  To see if you could benefit from screening, first find out if you are at high risk for lung cancer. Your doctor can help you decide your lung cancer risk.  What are the risks of screening?  CT screening for lung cancer isn't perfect. It can show an abnormal result when it turns out there wasn't any cancer. This is called a false-positive result. This means you may need more tests to make sure you don't have cancer. These tests can be harmful and cause a lot of worry.  These tests may include more CT scans and invasive testing like a lung biopsy. In a biopsy, the doctor takes a sample of tissue from inside your lung so it can be looked at under a microscope. A biopsy is the only way to tell if you have lung cancer. If the biopsy finds cancer, you and your doctor will have to decide how or whether to treat it.  Some lung cancers found on CT scans are harmless and would not have caused a problem if they had not been found through screening. But because doctors can't tell which ones  will turn out to be harmless, most will be treated. This means that you may get treatment--including surgery, radiation, or chemotherapy--that you don't need.  There is a risk of damage to cells or tissue from being exposed to radiation, including the small amounts used in CTs, X-rays, and other medical tests. Over time, exposure to radiation may cause cancer and other health problems. But in most cases, the  risk of getting cancer from being exposed to small amounts of radiation is low. It's not a reason to avoid these tests for most people.  What are the benefits of screening?  Your scan may be normal (negative).  For some people who are at higher risk, screening lowers the chance of dying of lung cancer. How much and how long you smoked helps to determine your risk level. Screening can find some cancers early, when treatment may be more likely to work.  What happens after screening?  The results of your CT scan will be sent to your doctor. Someone from your care team will explain the results of your scan and answer any questions you may have. If you need any follow-up, he or she will help you understand what to do next.  After a lung cancer screening, you can go back to your usual activities right away.  A lung cancer screening test can't tell if you have lung cancer. If your results are positive, your doctor can't tell whether an abnormal finding is a harmless nodule, cancer, or something else without doing more tests.  What can you do to help prevent lung cancer?  Some lung cancers can't be prevented. But if you smoke, quitting smoking is the best step you can take to prevent lung cancer. If you want to quit, your doctor can recommend medicines or other ways to help.  Follow-up care is a key part of your treatment and safety. Be sure to make and go to all appointments, and call your doctor if you are having problems. It's also a good idea to know your test results and keep a list of the medicines you take.  Where can you learn more?  Go to [employer] and enter Q940 to learn more about "Learning About Lung Cancer Screening."  Current as of: December 14, 2022  Content Version: 14.4   2024-2025 [place], [place].   Care instructions adapted under license by [facility]. If you have questions about a medical condition or this instruction, always ask your healthcare professional. Larene Beach, [facility], disclaims any warranty or liability for your use of this information.

## 2023-05-30 NOTE — Progress Notes (Signed)
 "  Have you been to the ER, urgent care clinic since your last visit?  Hospitalized since your last visit?"    NO    "Have you seen or consulted any other health care providers outside our system since your last visit?"    NO

## 2023-06-03 MED ORDER — CYANOCOBALAMIN 1000 MCG/ML IJ SOLN
1000 | Freq: Once | INTRAMUSCULAR | Status: AC
Start: 2023-06-03 — End: 2023-05-28
  Administered 2023-05-28: 16:00:00 1000 ug via INTRAMUSCULAR

## 2023-06-12 ENCOUNTER — Inpatient Hospital Stay: Admit: 2023-06-12 | Payer: Medicare (Managed Care) | Attending: Internal Medicine | Primary: Internal Medicine

## 2023-06-12 DIAGNOSIS — Z780 Asymptomatic menopausal state: Secondary | ICD-10-CM

## 2023-06-18 NOTE — Telephone Encounter (Signed)
 [facility] referral received to schedule an appointment. Reached out to the patient and lvm to call and schedule.     If the patient calls back please reach out to the front to schedule.

## 2023-06-20 ENCOUNTER — Encounter

## 2023-06-27 ENCOUNTER — Ambulatory Visit: Admit: 2023-06-27 | Discharge: 2023-06-27 | Payer: MEDICARE | Primary: Internal Medicine

## 2023-06-27 DIAGNOSIS — E538 Deficiency of other specified B group vitamins: Secondary | ICD-10-CM

## 2023-06-27 MED ORDER — CYANOCOBALAMIN 1000 MCG/ML IJ SOLN
1000 | Freq: Once | INTRAMUSCULAR | Status: AC
Start: 2023-06-27 — End: 2023-06-27
  Administered 2023-06-27: 19:00:00 1000 ug via INTRAMUSCULAR

## 2023-06-27 NOTE — Progress Notes (Signed)
 Patient came into clinic for a B12 Injections.  Patient supplied  Patient tolerated well    Placed into the right deltoid.

## 2023-07-03 ENCOUNTER — Ambulatory Visit: Admit: 2023-07-03 | Payer: Medicare (Managed Care) | Primary: Internal Medicine

## 2023-07-03 DIAGNOSIS — M5416 Radiculopathy, lumbar region: Secondary | ICD-10-CM

## 2023-07-03 NOTE — Progress Notes (Signed)
 EMG/ NCS Report  [facility]  [address], [address]  [place], [place]  [address]   Ph: [phone]   FAX: [phone]/ [phone]    Test Date:  07/03/2023    Patient: Tracy Jordan, Tracy Jordan DOB: Nov 24, 1950 Physician: Vergie Glass MD   ID#: [id number] SEX: Female Ref. Phys: Kochurani Puthumana MD     Patient History / Exam:  Patient with B12 deficiency and prediabetes complaining of bilatera lower leg sock like sensation. Assess for neuropathy vs radiculopathy.    EMG & NCV Findings:  Sensory and motor nerve conduction studies (as indicated in the tables) were within reference of normal.      Disposable concentric needle EMG (as indicated in the table) revealed mild denervation with complex repetitive discharges bilateral mid to lower lumbar paraspinal muscles.     Impressions:   This study is abnormal.  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 There is no electrodiagnostic evidence of a generalized large myelinated fiber polyneuropathy or myopathy at this time.    Thank you for the consult.    Vergie Glass MD    Nerve Conduction Studies  Anti Sensory Summary Table     Stim Site NR Peak (ms) Norm Peak (ms) P-T Amp (V) Norm P-T Amp Site1 Site2 Dist (cm)   Left Sup Fibular Anti Sensory (Lat ankle)   Lower leg    2.9 <4.6 16.8 >4 Lower leg Lat ankle 10.0   Right Sup Fibular Anti Sensory (Lat ankle)   Lower leg    2.9 <4.6 14.8 >4 Lower leg Lat ankle 10.0   Left Sural Anti Sensory (Lat Mall)   Calf    4.0 <4.5 26.7 >4.0 Calf Lat Mall 14.0   Right Sural Anti Sensory (Lat Mall)   Calf    4.1 <4.5 10.4 >4.0 Calf Lat Mall 14.0     Motor Summary Table     Stim Site NR Onset (ms) Norm Onset (ms) O-P Amp (mV) Norm O-P Amp Amp (Prev) (%) Site1 Site2 Dist (cm) Vel (m/s) Norm Vel (m/s)   Left Fibular Motor (Ext Dig Brev)   Ankle    4.5 <6.5 3.6 >1.1 100.0 Ankle Ext Dig Brev 8.0     B Fib    10.9  3.5  97.2 B Fib Ankle 28.0  44 >38   Poplt    12.8  3.4  97.1 Poplt B Fib 10.0 53 >42   Right Fibular Motor (Ext Dig Brev)   Ankle    3.8 <6.5 2.0 >1.1 100.0 Ankle Ext Dig Brev 8.0     B Fib    9.8  2.0  100.0 B Fib Ankle 28.0 47 >38   Poplt    11.8  1.6  80.0 Poplt B Fib 10.0 50 >42   Left Tibial Motor (Abd Hall Brev)   Ankle    5.6 <6.1 11.6 >1.1 100.0 Ankle Abd Hall Brev 8.0     Knee    14.8  7.7  66.4 Knee Ankle 38.0 41 >39   Right Tibial Motor (Abd Hall Brev)   Ankle    4.5 <6.1 8.7 >1.1 100.0 Ankle Abd Hall Brev 8.0     Knee    12.1  6.6  75.9 Knee Ankle 37.5 49 >39       EMG     Side Muscle Nerve Root Ins Act Fibs Psw Recrt Duration Amp Poly Comment   Right VastusLat  Femoral L2-4 Nml Nml Nml Nml Nml Nml Nml    Right MedGastroc Tibial S1-2 Nml Nml Nml Nml Nml Nml Nml    Right AntTibialis Dp Br Peron L4-5 Nml Nml Nml Nml Nml Nml Nml    Right Peroneus Long   Nml Nml Nml Nml Nml Nml Nml    Right TensorFascLat SupGluteal L4-5, S1 Nml Nml Nml Nml Nml Nml Nml    Right Lower Lumb Parasp Rami L5,S1 Incr 1+ 1+ Nml Nml Nml Nml CRD   Right Mid Lumb Parasp Rami L4,5 Incr 1+ 1+ Nml Nml Nml Nml CRD   Left VastusLat Femoral L2-4 Nml Nml Nml Nml Nml Nml Nml    Left MedGastroc Tibial S1-2 Nml Nml Nml Nml Nml Nml Nml    Left AntTibialis Dp Br Peron L4-5 Nml Nml Nml Nml Nml Nml Nml    Left Peroneus Long   Nml Nml Nml Nml Nml Nml Nml    Left TensorFascLat SupGluteal L4-5, S1 Nml Nml Nml Nml Nml Nml Nml    Left Lower Lumb Parasp Rami L5,S1 Incr 1+ 1+ Nml Nml Nml Nml CRD   Left Mid Lumb Parasp Rami L4,5 Incr 1+ 1+ Nml Nml Nml Nml CRD     Waveforms:

## 2023-07-22 ENCOUNTER — Inpatient Hospital Stay: Admit: 2023-07-22 | Payer: Medicare (Managed Care) | Attending: Internal Medicine | Primary: Internal Medicine

## 2023-07-22 DIAGNOSIS — Z87891 Personal history of nicotine dependence: Secondary | ICD-10-CM

## 2023-07-29 ENCOUNTER — Ambulatory Visit: Admit: 2023-07-29 | Discharge: 2023-07-29 | Payer: Medicare (Managed Care) | Primary: Internal Medicine

## 2023-07-29 DIAGNOSIS — E538 Deficiency of other specified B group vitamins: Secondary | ICD-10-CM

## 2023-07-29 MED ORDER — CYANOCOBALAMIN 1000 MCG/ML IJ SOLN
1000 | INTRAMUSCULAR | 1 refills | 180.00000 days | Status: DC
Start: 2023-07-29 — End: 2023-08-27

## 2023-07-29 MED ORDER — CYANOCOBALAMIN 1000 MCG/ML IJ SOLN
1000 | Freq: Once | INTRAMUSCULAR | Status: AC
Start: 2023-07-29 — End: 2023-07-29
  Administered 2023-07-29: 19:00:00 1000 ug via INTRAMUSCULAR

## 2023-07-29 NOTE — Telephone Encounter (Signed)
 PCP: Puthumana, Kochurani, MD    Last appt: @[email or web address] @  Future Appointments   Date Time Provider Department Center   08/07/2023 11:15 AM Ilah Males, MD [facility]   08/28/2023  2:45 PM RCHIM NURSE [facility] DEP   10/03/2023  4:00 PM Puthumana, Kochurani, MD [facility] DE[facility]       Requested Prescriptions     Pending Prescriptions Disp Refills    cyanocobalamin  1000 MCG/ML injection       Sig: Inject 0.1 mLs into the muscle every 30 days       Prior labs and Blood pressures:  BP Readings from Last 3 Encounters:   05/30/23 131/87   01/29/23 121/81   09/26/22 131/86     Lab Results   Component Value Date/Time    NA 138 05/28/2023 04:06 PM    K 4.1 05/28/2023 04:06 PM    CL 106 05/28/2023 04:06 PM    CO2 31 05/28/2023 04:06 PM    BUN 12 05/28/2023 04:06 PM     No results found for: "HBA1C", "HBA1CPOC"  Lab Results   Component Value Date/Time    CHOL 164 05/28/2023 04:06 PM    HDL 65 05/28/2023 04:06 PM    LDL 79 05/28/2023 04:06 PM    LDL 134 05/10/2022 12:00 AM    VLDL 20 05/28/2023 04:06 PM     No results found for: "VITD3"    Lab Results   Component Value Date/Time    TSH 0.650 01/21/2023 12:00 AM

## 2023-07-29 NOTE — Progress Notes (Signed)
 Patient is here for injection.After obtaining consent, and per orders of Dr.Puthumana. injection of Vitamin B 12  given by Runell Countryman, LPN as follows.    Dose amout :1 ml  Injection site :Deltoid Right   Route : IM    Patient tolerated injection well.        Lot# [id number]  NDC# 9562-1308-65  EXP# 12/19/2024

## 2023-08-07 ENCOUNTER — Ambulatory Visit
Admit: 2023-08-07 | Discharge: 2023-08-07 | Payer: Medicare (Managed Care) | Attending: "Endocrinology | Primary: Internal Medicine

## 2023-08-07 VITALS — BP 118/89 | HR 63 | Temp 97.50000°F | Ht 61.5 in | Wt 160.8 lb

## 2023-08-07 DIAGNOSIS — M816 Localized osteoporosis [Lequesne]: Secondary | ICD-10-CM

## 2023-08-07 MED ORDER — CHOLECALCIFEROL 25 MCG (1000 UT) PO TABS
25 | ORAL_TABLET | Freq: Every day | ORAL | 3 refills | Status: DC
Start: 2023-08-07 — End: 2023-08-09

## 2023-08-07 MED ORDER — CALCIUM 600 600 MG PO TABS
600 | ORAL_TABLET | Freq: Every day | ORAL | 3 refills | 30.00000 days | Status: DC
Start: 2023-08-07 — End: 2023-08-09

## 2023-08-07 NOTE — Progress Notes (Signed)
 [facility]     Endocrine consultation     CC:   Chief Complaint   Patient presents with    New Patient    Osteoporosis     KGU:RKYHCWCBJ, Kochurani, MD  Referring provider: Puthumana, Kochurani, Md  [address]  [place],  [place] [address]    73 y.o. female  has a past medical history of Arthritis, Chronic back pain greater than 3 months duration, Chronic pain, Decreased thyroid stimulating hormone level, Hepatitis C, Hypercholesterolemia, Hypertension, Osteoporosis, and Vitamin D  deficiency.    History of Present Illness  The patient presents for evaluation and management of osteoporosis.    Osteoporosis  - The patient has been referred to our clinic for the management of osteoporosis, a condition for which they have not previously sought endocrinological consultation.  - Their medical history is notable for the absence of fractures or bone-related injuries.  - Their primary care physician prescribed medication for bone health, which the patient did not adhere to.  - There is no history of thyroid dysfunction or calcium  metabolism disorders, and the patient has never been prescribed thyroid medication.  - They do not take calcium  or vitamin D  supplements.  - The patient reports no back pain, vertebral fractures, or loss of height.    Smoking  - They have a history of smoking and continue to smoke.    Spinal Stenosis  - The patient experiences morning back pain, which they attribute to spinal stenosis.  - An orthopedic specialist previously recommended physical therapy for this condition.  - Additionally, a neurologist diagnosed the patient with bilateral nerve impingement.    Subclinical Hyperthyroidism  - In 2023, the patient consulted Dr. Thalia Filler for subclinical hyperthyroidism.  - They think they underwent radioactive iodine treatment in the 1990s in [place] .    Prediabetes  - The patient has prediabetes, which is managed by their primary care physician.    Pulmonary  Nodules  - Pulmonary nodules have been identified, and their primary care physician is monitoring these nodules.    Supplemental information: Total hysterectomy at age 68 due to benign uterine fibroids, after which they continued to experience regular menstrual cycles.    SOCIAL HISTORY  - Admit to smoking    Full ROS completed this visit:  Negative for weight gain, weight loss, fevers, chills, blurry vision, changes in vision, chest pain, palpitations, leg swelling, shortness of breath, wheezing, snoring, abdominal pain, nausea, vomiting, diarrhea, constipation, frequent urination, dysuria, tremors, fainting, shakes, cold intolerance, hot intolerance, depression, anxiety, foot sores, rash.    Results  Bone Mineral Density     Indication: Asymptomatic menopausal state, history of smoking  Age: 89  Sex: Female.     Menopause status: postmenopausal.  Hormone replacement therapy: No      Current medication for osteoporosis: Vitamin D .     Comparison: 06/08/2021 performed on a lunar unit     Technique: Imaging was performed on the [employer]. FRAX was not reported  due to some T-scores at or below -2.5.        Excluded sites: None      Findings:        Femoral Neck: Left  Bone mineral density (gm/cm2): 0.671  % of peak bone mass: 71  % for age matched controls: 90  T-score: -2.0  Z-score: -0.5     Total Hip: Left  Bone mineral density (gm/cm2): 0.764  % of peak bone mass: 74  % for age  matched controls: 90  T-score: -1.7  Z-score: -0.6     Lumbar Spine: L1-L4  Bone mineral density (gm/cm2): 0.710  % of peak bone mass: 62  % for age matched controls: 80  T-score: -4.0  Z-score: -1.6     IMPRESSION:     This patient is osteoporotic using the World Health Organization criteria  Compared to the prior study, bone mineral density measurements are not  comparable as the patient was imaged on different units.       - Labs:   Reviewed in epic     - Imaging:    - Bone density scan (June 14, 2023):      - T-score: -4 at the  spine      - T-score: -1.7 at the left hip      - T-score: -2 at the left femoral neck    - CT of lungs: pulmonary nodules    - Spinal MRI: some stenosis in the spine    EXAM:  CT CHEST WITHOUT CONTRAST     INDICATION: Personal history of nicotine dependence.     COMPARISON: June 2024.     CONTRAST: None.     TECHNIQUE: Low dose unenhanced multislice helical CT was performed from the  thoracic inlet to the adrenal glands without intravenous contrast  administration. Contiguous 1.25 mm axial images were reconstructed and lung and  soft tissue windows were generated. Coronal and sagittal reformations and axial  MIP images were generated.  CT dose reduction was achieved through use of a  standardized protocol tailored for this examination and automatic exposure  control for dose modulation. Lack of intravenous contrast limits evaluation of  the vasculature, mediastinum, hila, and solid organs.     DOSE: CTDIvol 2.284 mGy     FINDINGS:  PLEURA: No effusion or pneumothorax.  LUNGS: Stable pulmonary nodules. No new suspicious pulmonary nodules.     CHEST WALL: No mass or axillary lymphadenopathy.  MEDIASTINUM: No mass or lymphadenopathy.  HILA: No mass or lymphadenopathy.  THORACIC AORTA: No aneurysm.  MAIN PULMONARY ARTERY: Normal in caliber.  TRACHEA/BRONCHI: Patent.  ESOPHAGUS: No wall thickening or dilatation.  HEART: Normal in size. Coronary artery calcium : present     UPPER ABDOMEN: The incidentally imaged upper abdomen is unremarkable.Aaron Aas  BONES: No evidence of aggressive bone lesion..     IMPRESSION:  Stable pulmonary nodules.     Lung-RADS Category: 2  Management recommendation: Continue annual low dose CT chest screening.     [email or web address]        Electronically signed by Gaither Juba        Exam Ended: 07/22/23 14:55 EDT     MRI OF THE LUMBAR SPINE WITHOUT CONTRAST: 10/19/2022 3:41 PM     INDICATION:  Radiculopathy, lumbar region     TECHNIQUE: Multiplanar and multisequence MRI  was obtained of the lumbar spine  without contrast.     COMPARISON: None.     FINDINGS:   Lumbar alignment is normal. There is mild fatty degenerative endplate change  L5-S1. Vertebral body heights are preserved. The visualized cord is normal in  caliber and signal intensity. The conus medullaris terminates at the L1-L2  level. The upper abdominal aorta is ectatic (29 mm) not frankly aneurysmal.     T12-L1: No herniation or stenosis.  L1-L2: No herniation or stenosis.  L2-L3: Facet osteoarthritis causes mild canal stenosis.  L3-L4: Facet osteoarthritis causes mild canal and mild bilateral neural  foraminal stenosis.  L4-L5: Facet osteoarthritis and  a mild disc bulge cause mild canal and mild  bilateral neural foraminal stenosis.  L5-S1: Facet osteoarthritis and loss of disc height cause bilateral neural  foraminal stenosis. Minimal disc bulge.     IMPRESSION:  1. Bilateral neural foraminal stenosis L5-S1.  2. More mild stenoses as described above.        Electronically signed by Wyvonna Heidelberg        Exam Ended: 10/19/22 15:41 EDT     EXAM:  US  HEAD NECK SOFT TISSUE THYROID     INDICATION:   Thyrotoxicosis, unspecified without thyrotoxic crisis or storm     COMPARISON: None.     FINDINGS: Real-time sonography of the thyroid gland was performed with a high  frequency linear transducer. Multiple static images were obtained.     Normal thyroid parenchymal echotexture. Tiny colloid cysts bilaterally. No solid  nodules.      The right lobe measures 5.0 x 1.6 x 1.6 cm and the left lobe measures 5.0 x 1.3  x 1.2 cm. The isthmus measures 3 mm.     IMPRESSION:  Tiny colloid cysts bilaterally. No suspicious thyroid nodules.               Exam Ended: 09/11/21 09:21 EDT       Current Outpatient Medications   Medication Sig Dispense Refill    vitamin D  (CHOLECALCIFEROL) 25 MCG (1000 UT) TABS tablet Take 2 tablets by mouth daily 180 tablet 3    calcium  carbonate (CALCIUM  600) 600 MG TABS tablet Take 1 tablet by mouth daily 90  tablet 3    cyanocobalamin  1000 MCG/ML injection Inject 1 mL into the muscle every 30 days 10 mL 1    amLODIPine  (NORVASC ) 5 MG tablet Take 1 tablet by mouth in the morning and at bedtime 180 tablet 1    lisinopril -hydroCHLOROthiazide  (PRINZIDE ;ZESTORETIC ) 10-12.5 MG per tablet TAKE 1 TABLET BY MOUTH ONCE DAILY 100 tablet 1    rosuvastatin  (CRESTOR ) 5 MG tablet Take 1 tablet by mouth daily 90 tablet 1     No current facility-administered medications for this visit.        Past Medical History:   Diagnosis Date    Arthritis     Chronic back pain greater than 3 months duration     Chronic pain     Decreased thyroid stimulating hormone level     Hepatitis C     Hypercholesterolemia     Hypertension     Osteoporosis     Vitamin D  deficiency         Past Surgical History:   Procedure Laterality Date    CATARACT REMOVAL Bilateral     CESAREAN SECTION      CYST REMOVAL      Cyst removed ofrom upper chest area    HYSTERECTOMY (CERVIX STATUS UNKNOWN)       h/o abdominal hysterectomy for uterine fibroids and pelvic pain at age 28    ROTATOR CUFF REPAIR Left         Social History     Tobacco Use    Smoking status: Every Day     Current packs/day: 0.25     Average packs/day: 0.3 packs/day for 53.5 years (13.4 ttl pk-yrs)     Types: Cigarettes     Start date: 49     Passive exposure: Current    Smokeless tobacco: Never   Substance Use Topics    Alcohol use: Yes     Alcohol/week: 1.0 standard drink of  alcohol     Types: 1 Glasses of wine per week     Comment: occ      Employer:  Retired     Family History   Problem Relation Age of Onset    Breast Cancer Maternal Aunt 89    Hypertension Maternal Aunt     Colon Cancer Sister 74    Hypertension Sister     Hypertension Brother     Hypertension Mother     Asthma Mother     Breast Cancer Mother 70        Blood pressure 118/89, pulse 63, temperature 97.5 F (36.4 C), temperature source Temporal, height 1.562 m (5' 1.5), weight 72.9 kg (160 lb 12.8 oz), SpO2 95%.   Physical  Exam    GENERAL: Well-developed, well-nourished female in no acute distress.  HENT: normocephalic, atraumatic   EYES: EOMI. No lid lag, proptosis, icterus, conjunctival injection, periorbital edema.  THYROID: No thyromegaly, no nodules appreciated  LYMPH: No submandibular, cervical, or supraclavicular lymphadenopathy.  CARDIO: Regular rate, no LE edema  RESP: Breathing comfortably. No use of accessory muscles.  GI: Soft, nontender, nondistended. No rebound/guarding. No palpable mass.  MSK: no joint swelling hands, no joint swelling or pain of the knee/ankle  NEUROLOGIC: No tremor. Speech coherent, no dysarthria.  PSYCHIATRIC: Pleasant, Normal affect, normal judgment.  SKIN: Normal temperature, no ecchymoses, no striae    LABS:  No results found for: HBA1CPOC    Lab Results   Component Value Date/Time    LABA1C 6.0 05/28/2023 04:06 PM    LABA1C 6.1 01/21/2023 12:00 AM    LABA1C 6.1 07/19/2022 12:00 AM    HGBA1CEXT 5.9 07/18/2021 12:00 AM    CREATININE 0.84 05/28/2023 04:06 PM    LABGLOM 75 05/28/2023 04:06 PM    LABGLOM 67 07/18/2021 12:00 AM       Lab Results   Component Value Date/Time    CHOL 164 05/28/2023 04:06 PM    TRIG 100 05/28/2023 04:06 PM    HDL 65 05/28/2023 04:06 PM       Lab Results   Component Value Date/Time    TSH 0.650 01/21/2023 12:00 AM       No results found for: CPEPLT, CPEPTIDE    Assessment and Plan     72 y.o.female has a past medical history of Arthritis, Chronic back pain greater than 3 months duration, Chronic pain, Decreased thyroid stimulating hormone level, Hepatitis C, Hypercholesterolemia, Hypertension, Osteoporosis, and Vitamin D  deficiency. seen in consultation for evaluation of The primary encounter diagnosis was Localized osteoporosis without current pathological fracture. Diagnoses of Anti-TPO antibodies present and Pre-diabetes were also pertinent to this visit.      ICD-10-CM    1. 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 Pre-diabetes  R73.03          Assessment & Plan  1. Osteoporosis.  - Bone density scan from 06/14/2023 shows a T-score of -4 at the spine, indicating osteoporosis.  - No previous treatments for bones; advised to reduce smoking as it is a risk factor for osteoporosis.  - Advised to avoid falls and ensure adequate calcium  intake, aiming for about 1000-1200 mg per day.  -  Prescription for vitamin D  2000 units daily and calcium  supplements provided; calcium  and vitamin D  levels to be checked in 3 months. Further treatment options will be considered once she is Vit D replete.   Fall prevention discussed at length  I reviewed that there are three therapeutic classes to consider at this time.   - will check PTH+Vit D in 3 mo     2. Elevated thyroid peroxidase antibodies.  - Thyroid stimulating immunoglobulin and TSH are currently normal.  - No immediate treatment required; thyroid function will be monitored. Currently normal.   - Reviewed history of subclinical hyperthyroidism and previous possible radioactive iodine treatment.  - Monitoring plan discussed; no new medications prescribed.    3. Prediabetes.  - Hemoglobin A1c has improved from 6.1% to 6%.  - Advised to avoid juices and sodas, walk for 15 minutes after each meal, and avoid eating right before bedtime.  - No medications needed at this time as the condition is improving on its own.  - Discussed lifestyle modifications to further improve glycemic control.    4. Pulmonary nodules.  - Pulmonary nodules are being monitored by their primary care physician.  - Reviewed history of smoking and recent CT of lungs.  - No new interventions required; continue monitoring with primary care.    Follow-up: The patient will follow up in 3 months.    This is a specialty focused visit,  addressing only pertinent Endocrine conditions. Patient was advised to continue routine general health care with their primary care provider.    Please note that this dictation was completed with Dragon and DAX, artificial intelligence software.  Quite often unanticipated grammatical, syntax, homophones, and other interpretive errors are inadvertently transcribed by the computer software.  Please disregard these errors.  Please excuse any errors that have escaped final proofreading.  Thank you.     The patient (or guardian, if applicable) and other individuals in attendance with the patient were advised that Artificial Intelligence will be utilized during this visit to record, process the conversation to generate a clinical note, and support improvement of the AI technology. The patient (or guardian, if applicable) and other individuals in attendance at the appointment consented to the use of AI, including the recording.      Return in about 3 months (around 11/07/2023).    Thank you for allowing me to participate in the care of this patient.     Ilah Males, MD  Endocrinologist     An After Visit Summary was printed and given to the patient.    CC: PCP: Puthumana, Kochurani, MD

## 2023-08-07 NOTE — Progress Notes (Signed)
 Tracy Jordan is a 73 y.o. female here for   Chief Complaint   Patient presents with    New Patient    Osteoporosis       1. Have you been to the ER, urgent care clinic since your last visit?  Hospitalized since your last visit? -n/a    2. Have you seen or consulted any other health care providers outside of the [facility] since your last visit?  Include any pap smears or colon screening.-n/a

## 2023-08-09 ENCOUNTER — Encounter

## 2023-08-09 MED ORDER — CHOLECALCIFEROL 25 MCG (1000 UT) PO TABS
25 | ORAL_TABLET | Freq: Every day | ORAL | 3 refills | Status: AC
Start: 2023-08-09 — End: ?

## 2023-08-09 MED ORDER — CALCIUM 600 600 MG PO TABS
600 | ORAL_TABLET | Freq: Every day | ORAL | 3 refills | 30.00000 days | Status: DC
Start: 2023-08-09 — End: 2023-10-30

## 2023-08-09 NOTE — Telephone Encounter (Signed)
 Vitamin D  25mcg and calcium  600mg  to new pharmacy [facility] closed

## 2023-08-11 ENCOUNTER — Encounter

## 2023-08-13 MED ORDER — AMLODIPINE BESYLATE 5 MG PO TABS
5 | ORAL_TABLET | Freq: Every day | ORAL | 0 refills | 90.00000 days | Status: DC
Start: 2023-08-13 — End: 2024-02-04

## 2023-08-13 MED ORDER — ROSUVASTATIN CALCIUM 5 MG PO TABS
5 | ORAL_TABLET | Freq: Every day | ORAL | 0 refills | 90.00000 days | Status: DC
Start: 2023-08-13 — End: 2024-02-04

## 2023-08-13 MED ORDER — LISINOPRIL-HYDROCHLOROTHIAZIDE 10-12.5 MG PO TABS
10-12.5 | ORAL_TABLET | Freq: Every day | ORAL | 0 refills | 30.00000 days | Status: DC
Start: 2023-08-13 — End: 2023-10-30

## 2023-08-27 MED ORDER — CYANOCOBALAMIN 1000 MCG/ML IJ SOLN
1000 | INTRAMUSCULAR | 1 refills | 90.00000 days | Status: DC
Start: 2023-08-27 — End: 2023-09-02

## 2023-08-27 NOTE — Telephone Encounter (Signed)
 Patient need B-12 medication shot to be sent to [facility] because the last prescription was to [facility] and they went out of business    cyanocobalamin  1000 MCG/ML injection     [id number] - [place], [place] - [address] [phone] - F [phone] [[id number]]

## 2023-08-28 ENCOUNTER — Encounter: Payer: Medicare (Managed Care) | Primary: Internal Medicine

## 2023-09-02 ENCOUNTER — Telehealth

## 2023-09-02 MED ORDER — CYANOCOBALAMIN 1000 MCG/ML IJ SOLN
1000 | INTRAMUSCULAR | 1 refills | 90.00000 days | Status: AC
Start: 2023-09-02 — End: ?

## 2023-09-02 NOTE — Telephone Encounter (Signed)
 Patient needs her B-12 to [facility]. Was sent to [facility]

## 2023-09-29 ENCOUNTER — Encounter

## 2023-10-03 ENCOUNTER — Ambulatory Visit
Admit: 2023-10-03 | Discharge: 2023-10-03 | Payer: Medicare (Managed Care) | Attending: Internal Medicine | Primary: Internal Medicine

## 2023-10-03 VITALS — BP 120/84 | HR 73 | Temp 98.00000°F | Resp 16 | Ht 61.5 in | Wt 160.0 lb

## 2023-10-03 DIAGNOSIS — I10 Essential (primary) hypertension: Principal | ICD-10-CM

## 2023-10-03 MED ORDER — ALENDRONATE SODIUM 70 MG PO TABS
70 | ORAL_TABLET | ORAL | 1 refills | 84.00000 days | Status: DC
Start: 2023-10-03 — End: 2024-02-04

## 2023-10-03 MED ORDER — CHOLECALCIFEROL 25 MCG (1000 UT) PO TABS
25 | ORAL_TABLET | Freq: Every day | ORAL | 3 refills | 84.00000 days | Status: DC
Start: 2023-10-03 — End: 2024-02-04

## 2023-10-03 NOTE — Progress Notes (Signed)
 [facility]  [address]  [place], [place]  [address]  Phone: [phone]      Tracy Jordan (DOB: [birth year]) is a 73 y.o. female, established patient, here for evaluation of the following chief complaint(s):  Follow-up and Hypertension         SUBJECTIVE/OBJECTIVE:  History of Present Illness  The patient is a 73 year old female with a past medical history of hypertension, B12 deficiency, prediabetes, hypercholesterolemia, peripheral neuropathy, and an MRI showing spinal stenosis at L5-S1 and a right upper lobe nodule, here for a 65-month follow-up.    She reports that she has not had her blood work done recently. She mentions that she no longer has Medicaid and is currently paying out-of-pocket for her healthcare expenses. She has declined the pneumonia and shingles vaccines. She has received the COVID-19 vaccine. She is unsure if she has received the hepatitis B vaccine. She is due for a mammogram. 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 She does not have reflux. She has been advised to take calcium  and vitamin D  supplements. She has been prescribed vitamin D  supplements. She has been prescribed vitamin B12 injections but has missed them for the past 2 months. She plans to resume these injections next month.    She experiences low back pain that radiates around her body. She has been informed that she has two pinched nerves on each side of her back. She continues to experience numbness in her legs.    She has a history of hepatitis C, for which she completed a course of medication. Subsequent testing showed no detectable virus.    She is on Prinzide  for her blood pressure and Crestor  for her cholesterol.    Tobacco: She smokes cigarettes.         Hemoglobin A1C   Date Value Ref Range Status   05/28/2023 6.0 (H) 4.0 - 5.6 % Final     Comment:     (NOTE)  HbA1C Interpretive Ranges  <5.7               Normal  5.7 - 6.4         Consider Prediabetes  >6.5              Consider Diabetes        Hemoglobin   Date Value Ref Range Status   01/21/2023 12.1 11.1 - 15.9 g/dL Final     Hematocrit   Date Value Ref Range Status   01/21/2023 36.8 34.0 - 46.6 % Final     Creatinine   Date Value Ref Range Status   05/28/2023 0.84 0.55 - 1.02 mg/dL Final     Glucose   Date Value Ref Range Status   05/28/2023 97 65 - 100 mg/dL Final   87/97/7975 88 70 - 99 mg/dL Final     TSH   Date Value Ref Range Status   01/21/2023 0.650 0.450 - 4.500 uIU/mL Final     Comment:     No apparent thyroid disorder. Additional testing not indicated. In  rare instances, Secondary Hypothyroidism as well as Subclinical  Hypothyroidism have been reported in some patients with normal TSH  values.       Potassium   Date Value Ref Range Status   05/28/2023 4.1 3.5 - 5.1 mmol/L Final     Cholesterol, Total   Date Value Ref Range Status   05/28/2023 164 <200  mg/dL Final     HDL   Date Value Ref Range Status   05/28/2023 65 mg/dL Final     Comment:     Based on NCEP ATP III, HDL Cholesterol <40 mg/dL is considered low  and >60 mg/dL is elevated.       Triglycerides   Date Value Ref Range Status   05/28/2023 100 <150 mg/dL Final     Comment:     Based on NCEP-ATP III:  Triglycerides <150 mg/dL  is considered  normal, 150-199 mg/dL  borderline high,  799-500 mg/dL high and   greater than or equal to 500 mg/dL very high.       Lab Results   Component Value Date    LDL 79 05/28/2023       MRI Result (most recent):  MRI LUMBAR SPINE WO CONTRAST 10/19/2022    Narrative  MRI OF THE LUMBAR SPINE WITHOUT CONTRAST: 10/19/2022 3:41 PM    INDICATION:  Radiculopathy, lumbar region    TECHNIQUE: Multiplanar and multisequence MRI was obtained of the lumbar spine  without contrast.    COMPARISON: None.    FINDINGS:  Lumbar alignment is normal. There is mild fatty degenerative endplate change  L5-S1. Vertebral body heights are preserved. The visualized cord is normal  in  caliber and signal intensity. The conus medullaris terminates at the L1-L2  level. The upper abdominal aorta is ectatic (29 mm) not frankly aneurysmal.    T12-L1: No herniation or stenosis.  L1-L2: No herniation or stenosis.  L2-L3: Facet osteoarthritis causes mild canal stenosis.  L3-L4: Facet osteoarthritis causes mild canal and mild bilateral neural  foraminal stenosis.  L4-L5: Facet osteoarthritis and a mild disc bulge cause mild canal and mild  bilateral neural foraminal stenosis.  L5-S1: Facet osteoarthritis and loss of disc height cause bilateral neural  foraminal stenosis. Minimal disc bulge.    Impression  1. Bilateral neural foraminal stenosis L5-S1.  2. More mild stenoses as described above.      Electronically signed by Medford Manners    CT Result (most recent):  CT LUNG SCREENING (INITIAL/ANNUAL) 07/22/2023    Narrative  EXAM:  CT CHEST WITHOUT CONTRAST    INDICATION: Personal history of nicotine dependence.    COMPARISON: June 2024.    CONTRAST: None.    TECHNIQUE: Low dose unenhanced multislice helical CT was performed from the  thoracic inlet to the adrenal glands without intravenous contrast  administration. Contiguous 1.25 mm axial images were reconstructed and lung and  soft tissue windows were generated. Coronal and sagittal reformations and axial  MIP images were generated.  CT dose reduction was achieved through use of a  standardized protocol tailored for this examination and automatic exposure  control for dose modulation. Lack of intravenous contrast limits evaluation of  the vasculature, mediastinum, hila, and solid organs.    DOSE: CTDIvol 2.284 mGy    FINDINGS:  PLEURA: No effusion or pneumothorax.  LUNGS: Stable pulmonary nodules. No new suspicious pulmonary nodules.    CHEST WALL: No mass or axillary lymphadenopathy.  MEDIASTINUM: No mass or lymphadenopathy.  HILA: No mass or lymphadenopathy.  THORACIC AORTA: No aneurysm.  MAIN PULMONARY ARTERY: Normal in  caliber.  TRACHEA/BRONCHI: Patent.  ESOPHAGUS: No wall thickening or dilatation.  HEART: Normal in size. Coronary artery calcium : present    UPPER ABDOMEN: The incidentally imaged upper abdomen is unremarkable.[name]  BONES: No evidence of aggressive bone lesion..    Impression  Stable pulmonary nodules.    Lung-RADS Category:  2  Management recommendation: Continue annual low dose CT chest screening.    [email or web address]      Electronically signed by Elia Mcalpine      Current Outpatient Medications   Medication Sig    vitamin D  (CHOLECALCIFEROL ) 25 MCG (1000 UT) TABS tablet Take 2 tablets by mouth daily    alendronate  (FOSAMAX ) 70 MG tablet Take 1 tablet by mouth every 7 days Take it with 1 glass of water on empty stomach.  Cannot lay down or eat for 1 hour    lisinopril -hydroCHLOROthiazide  (PRINZIDE ;ZESTORETIC ) 10-12.5 MG per tablet Take 1 tablet by mouth daily    rosuvastatin  (CRESTOR ) 5 MG tablet Take 1 tablet by mouth daily    amLODIPine  (NORVASC ) 5 MG tablet Take 1 tablet by mouth daily    calcium  carbonate (CALCIUM  600) 600 MG TABS tablet Take 1 tablet by mouth daily    cyanocobalamin  1000 MCG/ML injection Inject 1 mL into the muscle every 30 days (Patient not taking: Reported on 10/03/2023)     No current facility-administered medications for this visit.       Allergies   Allergen Reactions    Morphine Itching    Acetaminophen Rash    Oxycodone Rash    Oxycodone-Acetaminophen Itching and Nausea And Vomiting        Past Medical History:   Diagnosis Date    Arthritis     Chronic back pain greater than 3 months duration     Chronic pain     Decreased thyroid stimulating hormone level     Hepatitis C     Hypercholesterolemia     Hypertension     Osteoporosis     Vitamin D  deficiency         Family History   Problem Relation Age of Onset    Breast Cancer Maternal Aunt 89    Hypertension Maternal Aunt     Colon Cancer Sister 23    Hypertension Sister     Hypertension Brother      Hypertension Mother     Asthma Mother     Breast Cancer Mother 79        Past Surgical History:   Procedure Laterality Date    CATARACT REMOVAL Bilateral     CESAREAN SECTION      CYST REMOVAL      Cyst removed ofrom upper chest area    HYSTERECTOMY (CERVIX STATUS UNKNOWN)       h/o abdominal hysterectomy for uterine fibroids and pelvic pain at age 27    ROTATOR CUFF REPAIR Left        Social History     Tobacco Use    Smoking status: Every Day     Current packs/day: 0.25     Average packs/day: 0.3 packs/day for 53.6 years (13.4 ttl pk-yrs)     Types: Cigarettes     Start date: 67     Passive exposure: Current    Smokeless tobacco: Never   Vaping Use    Vaping status: Never Used   Substance Use Topics    Alcohol use: Yes     Alcohol/week: 1.0 standard drink of alcohol     Types: 1 Glasses of wine per week     Comment: occ    Drug use: Never         Review of Systems   Constitutional:  Negative for appetite change and fatigue.   Eyes:  Negative for visual disturbance.   Respiratory:  Negative for cough and  shortness of breath.    Cardiovascular:  Negative for chest pain and leg swelling.   Gastrointestinal:  Negative for abdominal pain, diarrhea, nausea and vomiting.   Genitourinary:  Negative for dysuria.   Musculoskeletal:  Positive for back pain.   Skin:  Negative for rash.   Neurological:  Negative for tremors and headaches.   Psychiatric/Behavioral:  The patient is not nervous/anxious.        BP 120/84 (BP Site: Right Upper Arm, Patient Position: Sitting, BP Cuff Size: Medium Adult)   Pulse 73   Temp 98 F (36.7 C) (Oral)   Resp 16   Ht 1.562 m (5' 1.5)   Wt 72.6 kg (160 lb)   SpO2 97%   BMI 29.74 kg/m      Physical Exam  Vitals and nursing note reviewed.    Gen:  Not  in no acute distress. Well built and nourished.  HEENT:  Pink conjunctivae, PERLA, EOMI, hearing intact Mouth: Moist mucous membranes. No TPC  Neck:  Supple, without masses, thyroid not enlarged  Resp:  No accessory muscle use, clear  breath sounds without wheezes rales or rhonchi  Card:  No murmurs, normal S1, S2 without thrills, bruits or peripheral edema.  Peripheral pulses felt bilaterally equal .  Abd:  Soft, non-tender, non-distended, normoactive bowel sounds are present, no palpable organomegaly and no detectable hernias  Lymph:  No cervical or inguinal adenopathy  Musc:  No cyanosis or clubbing. -ve tenderness lumbar spine.  SLR negative.  Gait: Normal  Skin:  No rashes or ulcers, skin turgor is good  Neuro: Alert and oriented x 3. Cranial nerves are grossly intact, no focal motor weakness, follows commands appropriately.  Sensation intact  Psych:  Good insight, mood normal.  ASSESSMENT/PLAN:  Below is the assessment and plan developed based on review of pertinent history, physical exam, labs, studies, and medications.    Assessment & Plan  1. Hypertension  - Currently on Prinzide  and amlodipine  for blood pressure management  - Blood pressure is stable on the current regimen  - Advised to continue current medications    2. Hypercholesterolemia  - Cholesterol levels reviewed, total cholesterol 164 mg/dL in 95/7974  - Currently on Crestor   - Advised to continue current medication and maintain a healthy diet    3. Prediabetes  - A1C was 6 in 05/2023.  - Advised to continue low-carb diet.    4. Peripheral neuropathy  - Reports numbness in legs patient has not gone for EMG studies yet.  Continue B12.    5.  Postmenopausal osteoporosis  - T-score of -4 in lumbar spine, indicating severe osteoporosis  - Alendronate  70 mg once weekly prescribed  - Instructed to take on an empty stomach with a glass of water, avoid lying down or eating for one hour after taking it, and not to take with orange juice  - Over-the-counter vitamin D  2000 units daily recommended.  Advised calcium  and weightbearing exercise.    6. B12 deficiency  - Missed B12 injections for the past two months  - Advised to pick up B12 injections from pharmacy and resume next month    7.  Spinal stenosis  - Spinal stenosis at L5-S1 contributing to back pain  - Advised to continue current management plan and consider physical therapy if symptoms worsen    8. Right upper lobe nodule  - Lung cancer screening in 07/2023 showed stable pulmonary nodule  - Advised to stop smoking to reduce risk of lung cancer  and save money    9. Health Maintenance  - Declined pneumonia and shingles vaccines  - Mammogram ordered  - Blood work, including vitamin D  and PTH levels, to be conducted before next visit    Follow-up  - The patient will follow up in 4 months   1. Essential hypertension, benign  -     Comprehensive Metabolic Panel; Future  2. Hypercholesterolemia  -     Lipid Panel; Future  -     TSH; Future  3. Prediabetes  -     Hemoglobin A1C; Future  4. Vitamin B 12 deficiency  5. Localized osteoporosis without current pathological fracture  -     vitamin D  (CHOLECALCIFEROL ) 25 MCG (1000 UT) TABS tablet; Take 2 tablets by mouth daily, Disp-180 tablet, R-3Normal  -     alendronate  (FOSAMAX ) 70 MG tablet; Take 1 tablet by mouth every 7 days Take it with 1 glass of water on empty stomach.  Cannot lay down or eat for 1 hour, Disp-12 tablet, R-1Normal  6. Chronic hepatitis C without hepatic coma (HCC)  Comments:  Already been treated  Assessment & Plan:   Monitored by specialist- no acute findings meriting change in the plan Repeat test -ve  7. Vitamin D  deficiency  -     Vitamin D  25 Hydroxy; Future  8. Encounter for screening mammogram for malignant neoplasm of breast  -     MAM TOMO DIGITAL SCREEN BILATERAL; Future       Orders Placed This Encounter    MAM TOMO DIGITAL SCREEN BILATERAL     Please obtain additional views and ultrasound if needed     Standing Status:   Future     Expected Date:   10/03/2023     Expiration Date:   12/02/2024    Comprehensive Metabolic Panel     Standing Status:   Future     Expected Date:   01/03/2024     Expiration Date:   04/02/2024    Hemoglobin A1C     Standing Status:   Future      Expected Date:   01/03/2024     Expiration Date:   04/02/2024    Lipid Panel     Standing Status:   Future     Expected Date:   01/03/2024     Expiration Date:   04/02/2024    TSH     Standing Status:   Future     Expected Date:   01/03/2024     Expiration Date:   04/02/2024    Vitamin D  25 Hydroxy     Standing Status:   Future     Expected Date:   01/03/2024     Expiration Date:   04/02/2024    PTH, Intact     Standing Status:   Future     Expected Date:   01/03/2024     Expiration Date:   04/02/2024    vitamin D  (CHOLECALCIFEROL ) 25 MCG (1000 UT) TABS tablet     Sig: Take 2 tablets by mouth daily     Dispense:  180 tablet     Refill:  3    alendronate  (FOSAMAX ) 70 MG tablet     Sig: Take 1 tablet by mouth every 7 days Take it with 1 glass of water on empty stomach.  Cannot lay down or eat for 1 hour     Dispense:  12 tablet     Refill:  1  Return in about 4 months (around 02/02/2024).     There are no Patient Instructions on file for this visit.     Health Maintenance Due   Topic Date Due    Hepatitis A vaccine (1 of 2 - Risk 2-dose series) Never done    Hepatitis B vaccine (1 of 3 - Risk 3-dose series) Never done    Flu vaccine (1) Never done    Breast cancer screen  10/16/2023      The patient (or guardian, if applicable) and other individuals in attendance with the patient were advised that Artificial Intelligence will be utilized during this visit to record and process the conversation to generate a clinical note. The patient (or guardian, if applicable) and other individuals in attendance at the appointment consented to the use of AI, including the recording.                 Aspects of this note may have been generated using voice recognition software. Despite editing, there may be unrecognized errors.    An electronic signature was used to authenticate this note.  -- DE LAMY, MD

## 2023-10-03 NOTE — Progress Notes (Signed)
 Chief Complaint   Patient presents with    Follow-up    Hypertension     BP 120/84 (BP Site: Right Upper Arm, Patient Position: Sitting, BP Cuff Size: Medium Adult)   Pulse 73   Temp 98 F (36.7 C) (Oral)   Resp 16   Ht 1.562 m (5' 1.5)   Wt 72.6 kg (160 lb)   SpO2 97%   BMI 29.74 kg/m       Have you been to the ER, urgent care clinic since your last visit?  Hospitalized since your last visit?   NO    Have you seen or consulted any other health care providers outside our system since your last visit?   NO

## 2023-10-03 NOTE — Assessment & Plan Note (Signed)
 Monitored by specialist- no acute findings meriting change in the plan Repeat test -ve

## 2023-10-09 ENCOUNTER — Encounter: Payer: Medicare (Managed Care) | Primary: Internal Medicine

## 2023-10-30 ENCOUNTER — Telehealth

## 2023-10-30 MED ORDER — CALCIUM 600 600 MG PO TABS
600 | ORAL_TABLET | Freq: Every day | ORAL | 3 refills | 30.00000 days | Status: DC
Start: 2023-10-30 — End: 2024-02-04

## 2023-10-30 MED ORDER — LISINOPRIL-HYDROCHLOROTHIAZIDE 10-12.5 MG PO TABS
10-12.5 | ORAL_TABLET | Freq: Every day | ORAL | 0 refills | Status: DC
Start: 2023-10-30 — End: 2024-02-03

## 2023-10-30 NOTE — Telephone Encounter (Signed)
 Patient said she ran out of medication     calcium  carbonate (CALCIUM  600) 600 MG TABS tablet      lisinopril -hydroCHLOROthiazide  (PRINZIDE ;ZESTORETIC ) 10-12.5 MG per tablet

## 2023-11-11 ENCOUNTER — Ambulatory Visit: Payer: Medicare (Managed Care) | Attending: "Endocrinology | Primary: Internal Medicine

## 2024-01-03 ENCOUNTER — Encounter

## 2024-02-03 MED ORDER — LISINOPRIL-HYDROCHLOROTHIAZIDE 10-12.5 MG PO TABS
10-12.5 | ORAL_TABLET | Freq: Every day | ORAL | 0 refills | Status: DC
Start: 2024-02-03 — End: 2024-02-04

## 2024-02-04 ENCOUNTER — Ambulatory Visit
Admit: 2024-02-04 | Discharge: 2024-02-04 | Payer: Medicare (Managed Care) | Attending: Internal Medicine | Primary: Internal Medicine

## 2024-02-04 ENCOUNTER — Encounter

## 2024-02-04 VITALS — BP 128/70 | HR 98 | Resp 16 | Ht 61.5 in | Wt 164.0 lb

## 2024-02-04 DIAGNOSIS — I10 Essential (primary) hypertension: Principal | ICD-10-CM

## 2024-02-04 MED ORDER — CALCIUM 600 600 MG PO TABS
600 | ORAL_TABLET | Freq: Every day | ORAL | 3 refills | Status: AC
Start: 2024-02-04 — End: ?

## 2024-02-04 MED ORDER — AMLODIPINE BESYLATE 5 MG PO TABS
5 | ORAL_TABLET | Freq: Every day | ORAL | 1 refills | Status: AC
Start: 2024-02-04 — End: ?

## 2024-02-04 MED ORDER — LISINOPRIL-HYDROCHLOROTHIAZIDE 10-12.5 MG PO TABS
10-12.5 | ORAL_TABLET | Freq: Every day | ORAL | 1 refills | Status: AC
Start: 2024-02-04 — End: ?

## 2024-02-04 MED ORDER — CHOLECALCIFEROL 25 MCG (1000 UT) PO TABS
25 | ORAL_TABLET | Freq: Every day | ORAL | 3 refills | Status: AC
Start: 2024-02-04 — End: ?

## 2024-02-04 MED ORDER — ALENDRONATE SODIUM 70 MG PO TABS
70 | ORAL_TABLET | ORAL | 1 refills | Status: AC
Start: 2024-02-04 — End: ?

## 2024-02-04 MED ORDER — ROSUVASTATIN CALCIUM 5 MG PO TABS
5 | ORAL_TABLET | Freq: Every day | ORAL | 1 refills | Status: AC
Start: 2024-02-04 — End: ?

## 2024-02-04 NOTE — Patient Instructions (Signed)
"  Patient advised to have low sodium diet, daily exercise, and increase daily fruit and vegetable intake.   Importance of weight reduction discussed with patient.   "

## 2024-02-04 NOTE — Progress Notes (Signed)
 "[facility]  [address]  [place], [place]  [address]  Phone: [phone]      Tracy Jordan (DOB: [birth year]) is a 73 y.o. female, established patient, here for evaluation of the following chief complaint(s):  Follow-up         SUBJECTIVE/OBJECTIVE:  History of Present Illness  The patient is a 73 year old female with a past medical history of hypertension, B12 deficiency, prediabetes, hypercholesterolemia, peripheral neuropathy, L5-S1 spinal stenosis, and a right upper lobe nodule, here for a 85-month follow-up.    She reports persistent numbness in her toes, describing it as feeling like she is wearing socks. This sensation is intermittent but more frequent than not. She has an upcoming appointment with a spine specialist on 02/24/2024.    She has been diagnosed with osteoporosis and is supposedly on  weekly medication regimen, although she is uncertain of the name of the medication. She does not take any calcium  supplements. She has not had any recent blood work done. She has not scheduled her mammogram yet. She has not received her influenza vaccine. She continues to smoke.    She has not been adhering to her B12 injection schedule and seeks advice on whether to resume it.      She is also requesting refills for her amlodipine , lisinopril , and Crestor  prescriptions. She took her last two vitamin D  tablets this morning.    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 Her aunt will be 46 years old on 22-Feb-2025. Her sister passed away at the age of 53.       Results  Labs   - T-score: -4   - B12 level: 126     Hemoglobin A1C   Date Value Ref Range Status   05/28/2023 6.0 (H) 4.0 - 5.6 % Final     Comment:     (NOTE)  HbA1C Interpretive Ranges  <5.7              Normal  5.7 - 6.4         Consider Prediabetes  >6.5              Consider Diabetes        Hemoglobin    Date Value Ref Range Status   01/21/2023 12.1 11.1 - 15.9 g/dL Final     Hematocrit   Date Value Ref Range Status   01/21/2023 36.8 34.0 - 46.6 % Final     Creatinine   Date Value Ref Range Status   05/28/2023 0.84 0.55 - 1.02 mg/dL Final     Glucose   Date Value Ref Range Status   05/28/2023 97 65 - 100 mg/dL Final   87/97/7975 88 70 - 99 mg/dL Final     TSH   Date Value Ref Range Status   01/21/2023 0.650 0.450 - 4.500 uIU/mL Final     Comment:     No apparent thyroid disorder. Additional testing not indicated. In  rare instances, Secondary Hypothyroidism as well as Subclinical  Hypothyroidism have been reported in some patients with normal TSH  values.       Potassium   Date Value Ref Range Status   05/28/2023 4.1 3.5 - 5.1 mmol/L Final     Cholesterol, Total   Date Value Ref Range Status   05/28/2023 164 <200 mg/dL Final  HDL   Date Value Ref Range Status   05/28/2023 65 mg/dL Final     Comment:     Based on NCEP ATP III, HDL Cholesterol <40 mg/dL is considered low  and >60 mg/dL is elevated.       Triglycerides   Date Value Ref Range Status   05/28/2023 100 <150 mg/dL Final     Comment:     Based on NCEP-ATP III:  Triglycerides <150 mg/dL  is considered  normal, 150-199 mg/dL  borderline high,  799-500 mg/dL high and   greater than or equal to 500 mg/dL very high.       Lab Results   Component Value Date    LDL 79 05/28/2023        MRI Result (most recent):  MRI LUMBAR SPINE WO CONTRAST 10/19/2022    Narrative  MRI OF THE LUMBAR SPINE WITHOUT CONTRAST: 10/19/2022 3:41 PM    INDICATION:  Radiculopathy, lumbar region    TECHNIQUE: Multiplanar and multisequence MRI was obtained of the lumbar spine  without contrast.    COMPARISON: None.    FINDINGS:  Lumbar alignment is normal. There is mild fatty degenerative endplate change  L5-S1. Vertebral body heights are preserved. The visualized cord is normal in  caliber and signal intensity. The conus medullaris terminates at the L1-L2  level. The upper abdominal  aorta is ectatic (29 mm) not frankly aneurysmal.    T12-L1: No herniation or stenosis.  L1-L2: No herniation or stenosis.  L2-L3: Facet osteoarthritis causes mild canal stenosis.  L3-L4: Facet osteoarthritis causes mild canal and mild bilateral neural  foraminal stenosis.  L4-L5: Facet osteoarthritis and a mild disc bulge cause mild canal and mild  bilateral neural foraminal stenosis.  L5-S1: Facet osteoarthritis and loss of disc height cause bilateral neural  foraminal stenosis. Minimal disc bulge.    Impression  1. Bilateral neural foraminal stenosis L5-S1.  2. More mild stenoses as described above.      Electronically signed by Medford Manners    CT Result (most recent):  CT LUNG SCREENING (INITIAL/ANNUAL) 07/22/2023    Narrative  EXAM:  CT CHEST WITHOUT CONTRAST    INDICATION: Personal history of nicotine dependence.    COMPARISON: June 2024.    CONTRAST: None.    TECHNIQUE: Low dose unenhanced multislice helical CT was performed from the  thoracic inlet to the adrenal glands without intravenous contrast  administration. Contiguous 1.25 mm axial images were reconstructed and lung and  soft tissue windows were generated. Coronal and sagittal reformations and axial  MIP images were generated.  CT dose reduction was achieved through use of a  standardized protocol tailored for this examination and automatic exposure  control for dose modulation. Lack of intravenous contrast limits evaluation of  the vasculature, mediastinum, hila, and solid organs.    DOSE: CTDIvol 2.284 mGy    FINDINGS:  PLEURA: No effusion or pneumothorax.  LUNGS: Stable pulmonary nodules. No new suspicious pulmonary nodules.    CHEST WALL: No mass or axillary lymphadenopathy.  MEDIASTINUM: No mass or lymphadenopathy.  HILA: No mass or lymphadenopathy.  THORACIC AORTA: No aneurysm.  MAIN PULMONARY ARTERY: Normal in caliber.  TRACHEA/BRONCHI: Patent.  ESOPHAGUS: No wall thickening or dilatation.  HEART: Normal in size. Coronary artery calcium :  present    UPPER ABDOMEN: The incidentally imaged upper abdomen is unremarkable.[name]  BONES: No evidence of aggressive bone lesion..    Impression  Stable pulmonary nodules.    Lung-RADS Category: 2  Management recommendation: Continue  annual low dose CT chest screening.    [email or web address]      Electronically signed by Elia Mcalpine      Current Outpatient Medications   Medication Sig    calcium  carbonate (CALCIUM  600) 600 MG TABS tablet Take 1 tablet by mouth daily    amLODIPine  (NORVASC ) 5 MG tablet Take 1 tablet by mouth daily    alendronate  (FOSAMAX ) 70 MG tablet Take 1 tablet by mouth every 7 days Take it with 1 glass of water on empty stomach.  Cannot lay down or eat for 1 hour    lisinopril -hydroCHLOROthiazide  (PRINZIDE ;ZESTORETIC ) 10-12.5 MG per tablet Take 1 tablet by mouth daily    rosuvastatin  (CRESTOR ) 5 MG tablet Take 1 tablet by mouth daily    vitamin D  (CHOLECALCIFEROL ) 25 MCG (1000 UT) TABS tablet Take 2 tablets by mouth daily    cyanocobalamin  1000 MCG/ML injection Inject 1 mL into the muscle every 30 days     No current facility-administered medications for this visit.       Allergies   Allergen Reactions    Morphine Itching    Acetaminophen Rash    Oxycodone Rash    Oxycodone-Acetaminophen Itching and Nausea And Vomiting        Past Medical History:   Diagnosis Date    Arthritis     Chronic back pain greater than 3 months duration     Chronic pain     Decreased thyroid stimulating hormone level     Hepatitis C     Hypercholesterolemia     Hypertension     Osteoporosis     Vitamin D  deficiency         Family History   Problem Relation Age of Onset    Breast Cancer Maternal Aunt 89    Hypertension Maternal Aunt     Colon Cancer Sister 81    Hypertension Sister     Hypertension Brother     Hypertension Mother     Asthma Mother     Breast Cancer Mother 16        Past Surgical History:   Procedure Laterality Date    CATARACT REMOVAL Bilateral     CESAREAN SECTION      CYST  REMOVAL      Cyst removed ofrom upper chest area    HYSTERECTOMY (CERVIX STATUS UNKNOWN)       h/o abdominal hysterectomy for uterine fibroids and pelvic pain at age 78    ROTATOR CUFF REPAIR Left        Social History     Tobacco Use    Smoking status: Every Day     Current packs/day: 0.25     Average packs/day: 0.3 packs/day for 54.0 years (13.5 ttl pk-yrs)     Types: Cigarettes     Start date: 42     Passive exposure: Current    Smokeless tobacco: Never   Vaping Use    Vaping status: Never Used   Substance Use Topics    Alcohol use: Yes     Alcohol/week: 1.0 standard drink of alcohol     Types: 1 Glasses of wine per week     Comment: occ    Drug use: Never         Review of Systems   Constitutional:  Negative for appetite change and fatigue.   Eyes:  Negative for visual disturbance.   Respiratory:  Negative for cough and shortness of breath.    Cardiovascular:  Negative for chest  pain and leg swelling.   Gastrointestinal:  Negative for abdominal pain, diarrhea, nausea and vomiting.   Genitourinary:  Negative for dysuria.   Skin:  Negative for rash.   Neurological:  Negative for tremors and headaches.   Psychiatric/Behavioral:  The patient is not nervous/anxious.        BP 128/70 (BP Site: Right Upper Arm, Patient Position: Sitting, BP Cuff Size: Medium Adult)   Pulse 98   Resp 16   Ht 1.562 m (5' 1.5)   Wt 74.4 kg (164 lb)   SpO2 99%   BMI 30.49 kg/m      Physical Exam  Vitals and nursing note reviewed.    Gen:  Not  in no acute distress. Well built and nourished.  HEENT:  Pink conjunctivae, PERLA, EOMI, hearing intact Mouth: Moist mucous membranes. No TPC  Neck:  Supple, without masses, thyroid not enlarged  Resp:  No accessory muscle use, clear breath sounds without wheezes rales or rhonchi  Card:  No murmurs, normal S1, S2 without thrills, bruits or peripheral edema.  Peripheral pulses felt bilaterally equal .  Abd:  Soft, non-tender, non-distended, normoactive bowel sounds are present, no palpable  organomegaly and no detectable hernias  Lymph:  No cervical or inguinal adenopathy  Musc:  No cyanosis or clubbing. No tenderness lumbar spine.  SLR negative.  Gait: Normal  Skin:  No rashes or ulcers, skin turgor is good  Neuro: Alert and oriented x 3. Cranial nerves are grossly intact, no focal motor weakness, follows commands appropriately.    Psych:  Good insight, mood normal.  ASSESSMENT/PLAN:  Below is the assessment and plan developed based on review of pertinent history, physical exam, labs, studies, and medications.    Assessment & Plan  1. Hypertension:  - Blood pressure well-regulated with current regimen of Prinzide  and amlodipine   - Amlodipine  and lisinopril  refills to be sent to pharmacy  - Continue with current medications    2. Prediabetes:  - Most recent A1c level recorded at 6.0  - Re-evaluation of A1c level necessary      3. Hypercholesterolemia:  - Adhering to Crestor  regimen  - Blood work advised to monitor cholesterol levels  - Prescription refill for Crestor  provided    4. B12 deficiency:  - Previous B12 level significantly low at 126  - Recheck of B12 level to be conducted  - Continue with B12 injections    5. Osteoporosis:  - Does not wish to consult with endocrinologist  - Restart calcium , vitamin D , and Fosamax .  Side effects discussed.    6. Lumbar spine stenosis:  - Scheduled appointment with Dr. Rana on 02/24/2024    Follow-up:  - Patient to follow up in 3 to 4 months       1. Essential hypertension, benign  -     amLODIPine  (NORVASC ) 5 MG tablet; Take 1 tablet by mouth daily, Disp-90 tablet, R-1Normal  -     lisinopril -hydroCHLOROthiazide  (PRINZIDE ;ZESTORETIC ) 10-12.5 MG per tablet; Take 1 tablet by mouth daily, Disp-90 tablet, R-1Normal  -     Comprehensive Metabolic Panel; Future  -     Urinalysis with Reflex to Culture; Future  2. Prediabetes  -     Hemoglobin A1C; Future  3. Vitamin B 12 deficiency  -     CBC; Future  -     Vitamin B12 & Folate; Future  4.  Hypercholesterolemia  -     rosuvastatin  (CRESTOR ) 5 MG tablet; Take 1 tablet by mouth daily,  Disp-90 tablet, R-1Normal  -     Lipid Panel; Future  -     TSH; Future  5. Post-menopausal osteoporosis  -     calcium  carbonate (CALCIUM  600) 600 MG TABS tablet; Take 1 tablet by mouth daily, Disp-90 tablet, R-3Normal  -     alendronate  (FOSAMAX ) 70 MG tablet; Take 1 tablet by mouth every 7 days Take it with 1 glass of water on empty stomach.  Cannot lay down or eat for 1 hour, Disp-12 tablet, R-1Normal  -     vitamin D  (CHOLECALCIFEROL ) 25 MCG (1000 UT) TABS tablet; Take 2 tablets by mouth daily, Disp-180 tablet, R-3Normal  -     Vitamin D  25 Hydroxy; Future  -     PTH, Intact; Future  6. Encounter for screening mammogram for malignant neoplasm of breast  -     MAM TOMO DIGITAL SCREEN BILATERAL; Future  7. Spinal stenosis of lumbar region without neurogenic claudication       Orders Placed This Encounter    MAM TOMO DIGITAL SCREEN BILATERAL     Please obtain additional views and ultrasound if needed     Standing Status:   Future     Expected Date:   02/04/2024     Expiration Date:   04/06/2025    Hemoglobin A1C     Standing Status:   Future     Expected Date:   02/04/2024     Expiration Date:   02/03/2025    Comprehensive Metabolic Panel     Standing Status:   Future     Expected Date:   02/04/2024     Expiration Date:   02/03/2025    CBC     Standing Status:   Future     Expected Date:   02/04/2024     Expiration Date:   02/03/2025    Lipid Panel     Standing Status:   Future     Expected Date:   02/04/2024     Expiration Date:   02/03/2025    TSH     Standing Status:   Future     Expected Date:   02/04/2024     Expiration Date:   02/03/2025    Vitamin D  25 Hydroxy     Standing Status:   Future     Expected Date:   02/04/2024     Expiration Date:   02/03/2025    PTH, Intact     Standing Status:   Future     Expected Date:   02/04/2024     Expiration Date:   02/03/2025    Urinalysis with Reflex to Culture     Standing  Status:   Future     Expected Date:   02/04/2024     Expiration Date:   02/03/2025     SPECIFY(EX-CATH,MIDSTREAM,CYSTO,ETC)?:   midstream    Vitamin B12 & Folate     Standing Status:   Future     Expected Date:   02/04/2024     Expiration Date:   02/03/2025    calcium  carbonate (CALCIUM  600) 600 MG TABS tablet     Sig: Take 1 tablet by mouth daily     Dispense:  90 tablet     Refill:  3    amLODIPine  (NORVASC ) 5 MG tablet     Sig: Take 1 tablet by mouth daily     Dispense:  90 tablet     Refill:  1    alendronate  (  FOSAMAX ) 70 MG tablet     Sig: Take 1 tablet by mouth every 7 days Take it with 1 glass of water on empty stomach.  Cannot lay down or eat for 1 hour     Dispense:  12 tablet     Refill:  1    lisinopril -hydroCHLOROthiazide  (PRINZIDE ;ZESTORETIC ) 10-12.5 MG per tablet     Sig: Take 1 tablet by mouth daily     Dispense:  90 tablet     Refill:  1    rosuvastatin  (CRESTOR ) 5 MG tablet     Sig: Take 1 tablet by mouth daily     Dispense:  90 tablet     Refill:  1    vitamin D  (CHOLECALCIFEROL ) 25 MCG (1000 UT) TABS tablet     Sig: Take 2 tablets by mouth daily     Dispense:  180 tablet     Refill:  3        Return in about 4 months (around 06/04/2024).     Patient Instructions   Patient advised to have low sodium diet, daily exercise, and increase daily fruit and vegetable intake.   Importance of weight reduction discussed with patient.      Health Maintenance Due   Topic Date Due    Hepatitis A vaccine (1 of 2 - Risk 2-dose series) Never done    Hepatitis B vaccine (1 of 3 - Risk 3-dose series) Never done    Breast cancer screen  10/16/2023    COVID-19 Vaccine (4 - 2025-26 season) 10/21/2023      The patient (or guardian, if applicable) and other individuals in attendance with the patient were advised that Artificial Intelligence will be utilized during this visit to record and process the conversation to generate a clinical note. The patient (or guardian, if applicable) and other individuals in attendance at  the appointment consented to the use of AI, including the recording.                 Aspects of this note may have been generated using voice recognition software. Despite editing, there may be unrecognized errors.    An electronic signature was used to authenticate this note.  -- DE LAMY, MD   "

## 2024-02-04 NOTE — Progress Notes (Signed)
"  Chief Complaint   Patient presents with    Follow-up     BP 128/70 (BP Site: Right Upper Arm, Patient Position: Sitting, BP Cuff Size: Medium Adult)   Pulse 98   Resp 16   Ht 1.562 m (5' 1.5)   Wt 74.4 kg (164 lb)   SpO2 99%   BMI 30.49 kg/m       Have you been to the ER, urgent care clinic since your last visit?  Hospitalized since your last visit?   No    Have you seen or consulted any other health care providers outside our system since your last visit?   NO    Have you had a mammogram?   NO    Date of last Mammogram: 10/16/2022            "

## 2024-02-10 ENCOUNTER — Inpatient Hospital Stay: Admit: 2024-02-10 | Payer: Medicare (Managed Care) | Primary: Internal Medicine

## 2024-02-10 DIAGNOSIS — R7303 Prediabetes: Secondary | ICD-10-CM

## 2024-02-10 DIAGNOSIS — I10 Essential (primary) hypertension: Principal | ICD-10-CM

## 2024-02-10 LAB — URINALYSIS WITH REFLEX TO CULTURE
BACTERIA, URINE: NEGATIVE /HPF
Bilirubin, Urine: NEGATIVE
Glucose, Ur: NEGATIVE mg/dL
Ketones, Urine: NEGATIVE mg/dL
Leukocyte Esterase, Urine: NEGATIVE
Nitrite, Urine: NEGATIVE
Protein, UA: NEGATIVE mg/dL
Specific Gravity, UA: 1.021 (ref 1.003–1.030)
Urobilinogen, Urine: 4 EU/dL — ABNORMAL HIGH (ref 0.1–1.0)
pH, Urine: 6 (ref 5.0–8.0)

## 2024-02-10 LAB — CBC WITH AUTO DIFFERENTIAL
Basophils %: 0.5 % (ref 0.0–1.0)
Basophils Absolute: 0.02 K/UL (ref 0.00–0.10)
Eosinophils %: 5.7 % (ref 0.0–7.0)
Eosinophils Absolute: 0.23 K/UL (ref 0.00–0.40)
Hematocrit: 37.2 % (ref 35.0–47.0)
Hemoglobin: 12.3 g/dL (ref 11.5–16.0)
Immature Granulocytes %: 0.2 % (ref 0–0.5)
Immature Granulocytes Absolute: 0.01 K/UL (ref 0.00–0.04)
Lymphocytes %: 41.2 % (ref 12.0–49.0)
Lymphocytes Absolute: 1.66 K/UL (ref 0.80–3.50)
MCH: 30.7 pg (ref 26.0–34.0)
MCHC: 33.1 g/dL (ref 30.0–36.5)
MCV: 92.8 FL (ref 80.0–99.0)
MPV: 9.5 FL (ref 8.9–12.9)
Monocytes %: 6.9 % (ref 5.0–13.0)
Monocytes Absolute: 0.28 K/UL (ref 0.00–1.00)
Neutrophils %: 45.5 % (ref 32.0–75.0)
Neutrophils Absolute: 1.83 K/UL (ref 1.80–8.00)
Nucleated RBCs: 0 /100{WBCs}
Platelets: 262 K/uL (ref 150–400)
RBC: 4.01 M/uL (ref 3.80–5.20)
RDW: 13.6 % (ref 11.5–14.5)
WBC: 4 K/uL (ref 3.6–11.0)
nRBC: 0 K/uL (ref 0.00–0.01)

## 2024-02-10 LAB — LIPID PANEL
Chol/HDL Ratio: 2.1 (ref 0.0–5.0)
Cholesterol, Total: 157 mg/dL (ref 0–200)
HDL: 75 mg/dL — ABNORMAL HIGH (ref 40–60)
LDL Cholesterol: 68 mg/dL (ref 0–100)
Triglycerides: 68 mg/dL (ref 0–150)
VLDL Cholesterol Calculated: 14 mg/dL

## 2024-02-10 LAB — COMPREHENSIVE METABOLIC PANEL
ALT: 8 U/L — ABNORMAL LOW (ref 10–35)
AST: 19 U/L (ref 10–35)
Albumin/Globulin Ratio: 1.1 (ref 1.1–2.2)
Albumin: 4 g/dL (ref 3.5–5.2)
Alk Phosphatase: 97 U/L (ref 35–104)
Anion Gap: 10 mmol/L (ref 2–14)
BUN/Creatinine Ratio: 16 (ref 12–20)
BUN: 13 mg/dL (ref 8–23)
CO2: 30 mmol/L — ABNORMAL HIGH (ref 20–29)
Calcium: 9.5 mg/dL (ref 8.8–10.2)
Chloride: 104 mmol/L (ref 98–107)
Creatinine: 0.82 mg/dL (ref 0.60–1.00)
Est, Glom Filt Rate: 77 ml/min/1.73m2 (ref >59)
Globulin: 3.7 g/dL (ref 2.0–4.0)
Glucose: 93 mg/dL (ref 65–100)
Potassium: 3.7 mmol/L (ref 3.5–5.1)
Sodium: 144 mmol/L (ref 136–145)
Total Bilirubin: 0.3 mg/dL (ref 0.0–1.2)
Total Protein: 7.7 g/dL (ref 6.4–8.3)

## 2024-02-10 LAB — PTH, INTACT
Calcium: 9.3 mg/dL (ref 8.8–10.2)
Pth Intact: 21.4 pg/mL

## 2024-02-10 LAB — VITAMIN D 25 HYDROXY: Vit D, 25-Hydroxy: 39.9 ng/mL (ref 30.0–100.0)

## 2024-02-10 LAB — HEMOGLOBIN A1C
Estimated Avg Glucose: 131 mg/dL
Hemoglobin A1C: 6.2 % — ABNORMAL HIGH (ref 4.0–5.6)

## 2024-02-10 LAB — TSH REFLEX TO FT4: TSH w Free Thyroid if Abnormal: 0.46 u[IU]/mL (ref 0.270–4.200)

## 2024-02-10 LAB — VITAMIN B12 & FOLATE
Folate: 15 ng/mL (ref 4.8–24.2)
Vitamin B-12: 311 pg/mL (ref 232–1245)

## 2024-02-16 ENCOUNTER — Encounter

## 2024-02-17 NOTE — Telephone Encounter (Signed)
"-----   Message from Dr. Kochurani Puthumana, MD sent at 02/16/2024  5:49 PM EST -----  Please let patient know I have placed referral to Dr. Viviana urology  ----- Message -----  From: Glennon Stabs, [place]  Sent: 02/11/2024   1:28 PM EST  To: De Lamy, MD    Spoke with patient and relayed message. She said could you please refer to a urology.  ----- Message -----  From: Puthumana, Kochurani, MD  Sent: 02/10/2024   5:43 PM EST  To: Dwana Heckle [facility] Cl#    Please let patient know A1c is 6.2 increased from 6.  Need to stay on low-carb diet.  Small blood in the urine.  Vitamin B12 to the low side 311.  Can take vitamin B12 1000 mcg over-the-counter   vitamin D  level is normal at 39.9.  TSH is normal.  Calcium  is also good.  CBC within normal limit.  I am not sure why she has more blood in the urine.  Since she is a smoker, it might be worth it to   see a urologist for cystoscopy  ----- Message -----  From: Edi, [facility]  Sent: 02/10/2024  10:31 AM EST  To: Kochurani Puthumana, MD    "

## 2024-02-17 NOTE — Telephone Encounter (Signed)
"  Attempted to contact the patient, LVM to return call.  "

## 2024-03-05 DIAGNOSIS — R3121 Asymptomatic microscopic hematuria: Principal | ICD-10-CM

## 2024-03-12 ENCOUNTER — Encounter
Payer: Medicare (Managed Care) | Attending: Student in an Organized Health Care Education/Training Program | Primary: Internal Medicine

## 2024-04-09 ENCOUNTER — Encounter
Payer: Medicare (Managed Care) | Attending: Student in an Organized Health Care Education/Training Program | Primary: Internal Medicine
